# Patient Record
Sex: Female | Born: 1990 | Race: Black or African American | Hispanic: No | Smoking: Current every day smoker
Health system: Southern US, Community
[De-identification: ages and names within clinical notes are randomized; demographics above are authoritative.]

## PROBLEM LIST (undated history)

## (undated) ENCOUNTER — Inpatient Hospital Stay: Payer: Self-pay

## (undated) DIAGNOSIS — Z8742 Personal history of other diseases of the female genital tract: Secondary | ICD-10-CM

## (undated) DIAGNOSIS — Z862 Personal history of diseases of the blood and blood-forming organs and certain disorders involving the immune mechanism: Secondary | ICD-10-CM

## (undated) DIAGNOSIS — Z87440 Personal history of urinary (tract) infections: Secondary | ICD-10-CM

## (undated) DIAGNOSIS — O24419 Gestational diabetes mellitus in pregnancy, unspecified control: Secondary | ICD-10-CM

## (undated) DIAGNOSIS — J449 Chronic obstructive pulmonary disease, unspecified: Secondary | ICD-10-CM

## (undated) DIAGNOSIS — Z789 Other specified health status: Secondary | ICD-10-CM

## (undated) HISTORY — DX: Personal history of diseases of the blood and blood-forming organs and certain disorders involving the immune mechanism: Z86.2

## (undated) HISTORY — DX: Personal history of urinary (tract) infections: Z87.440

## (undated) HISTORY — DX: Chronic obstructive pulmonary disease, unspecified: J44.9

## (undated) HISTORY — PX: NO PAST SURGERIES: SHX2092

## (undated) HISTORY — PX: LEEP: SHX91

## (undated) HISTORY — PX: OTHER SURGICAL HISTORY: SHX169

## (undated) HISTORY — DX: Personal history of other diseases of the female genital tract: Z87.42

---
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
## 2010-04-23 ENCOUNTER — Emergency Department: Payer: Self-pay | Admitting: Internal Medicine

## 2010-07-07 ENCOUNTER — Emergency Department: Payer: Self-pay | Admitting: Emergency Medicine

## 2010-07-14 ENCOUNTER — Encounter: Payer: Self-pay | Admitting: Obstetrics & Gynecology

## 2010-08-30 ENCOUNTER — Encounter: Payer: Self-pay | Admitting: Pediatric Cardiology

## 2010-09-13 ENCOUNTER — Encounter: Payer: Self-pay | Admitting: Pediatric Cardiology

## 2010-10-06 ENCOUNTER — Encounter: Payer: Self-pay | Admitting: Maternal and Fetal Medicine

## 2010-10-13 ENCOUNTER — Encounter: Payer: Self-pay | Admitting: Maternal and Fetal Medicine

## 2010-10-20 ENCOUNTER — Encounter: Payer: Self-pay | Admitting: Maternal and Fetal Medicine

## 2010-10-27 ENCOUNTER — Encounter: Payer: Self-pay | Admitting: Maternal & Fetal Medicine

## 2010-10-31 ENCOUNTER — Encounter: Payer: Self-pay | Admitting: Obstetrics and Gynecology

## 2010-11-03 ENCOUNTER — Encounter: Payer: Self-pay | Admitting: Obstetrics and Gynecology

## 2010-11-10 ENCOUNTER — Encounter: Payer: Self-pay | Admitting: Maternal & Fetal Medicine

## 2010-11-14 ENCOUNTER — Encounter: Payer: Self-pay | Admitting: Maternal and Fetal Medicine

## 2010-11-21 ENCOUNTER — Encounter: Payer: Self-pay | Admitting: Obstetrics and Gynecology

## 2010-12-01 ENCOUNTER — Inpatient Hospital Stay: Payer: Self-pay | Admitting: Obstetrics and Gynecology

## 2011-01-13 ENCOUNTER — Emergency Department: Payer: Self-pay | Admitting: Emergency Medicine

## 2012-04-09 IMAGING — US US FETAL BPP W/O NON-STRESS - NRPT
1 series · 8 of 8 positions shown · non-contrast
Comparison: none

[Series 1: us fetal bpp w/o non-stress - nrpt · 8 of 8 slices shown]
[im 1/8]
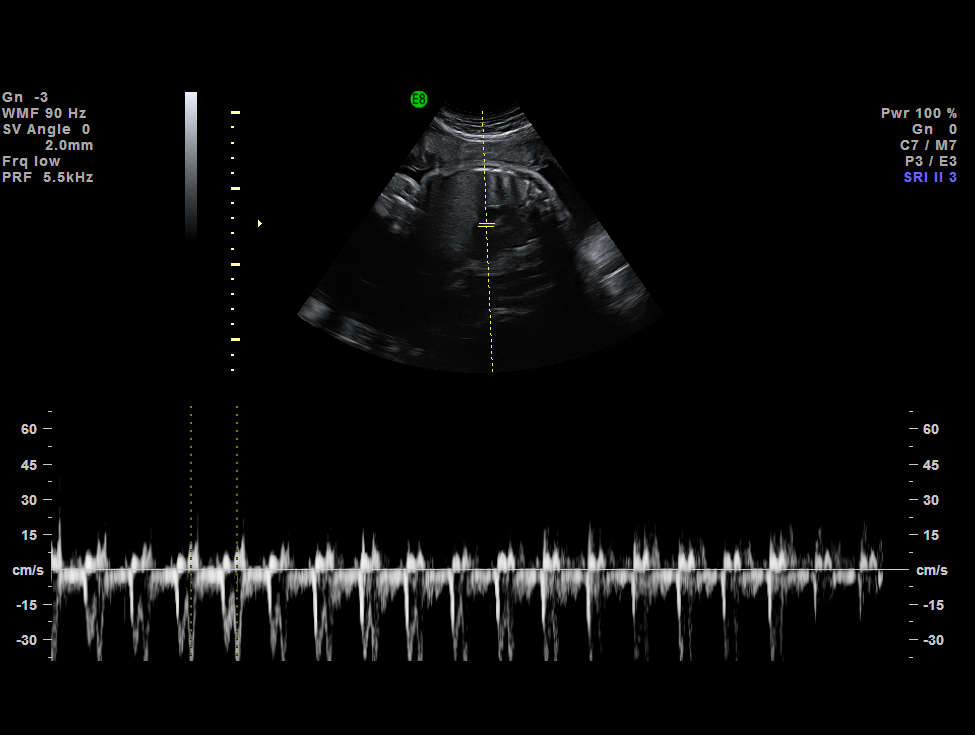
[im 2/8]
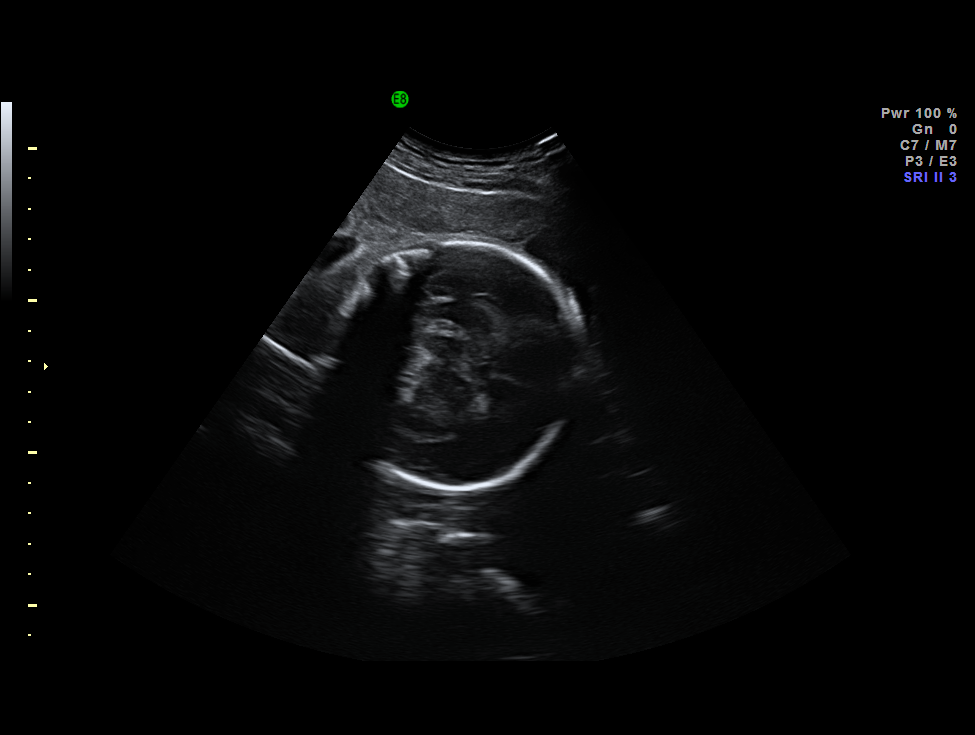
[im 3/8]
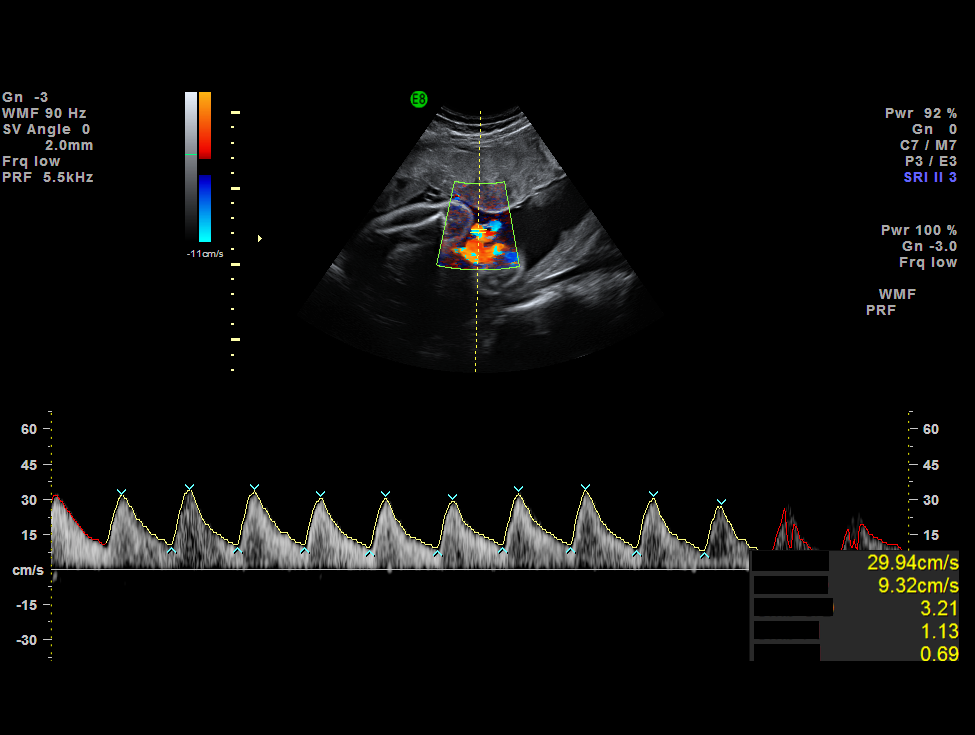
[im 4/8]
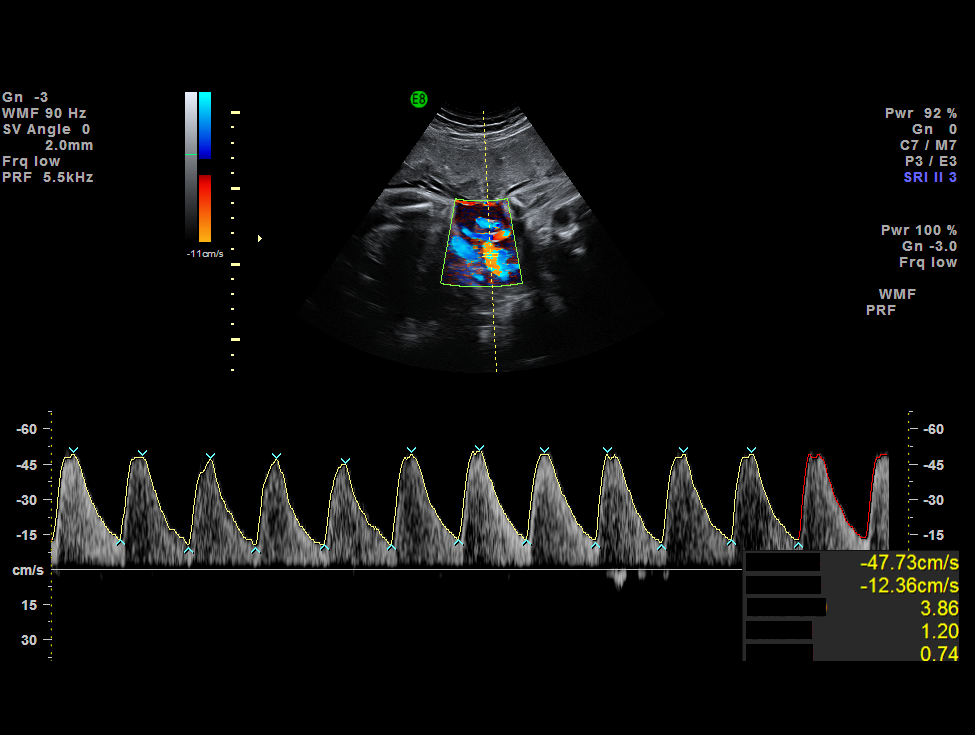
[im 5/8]
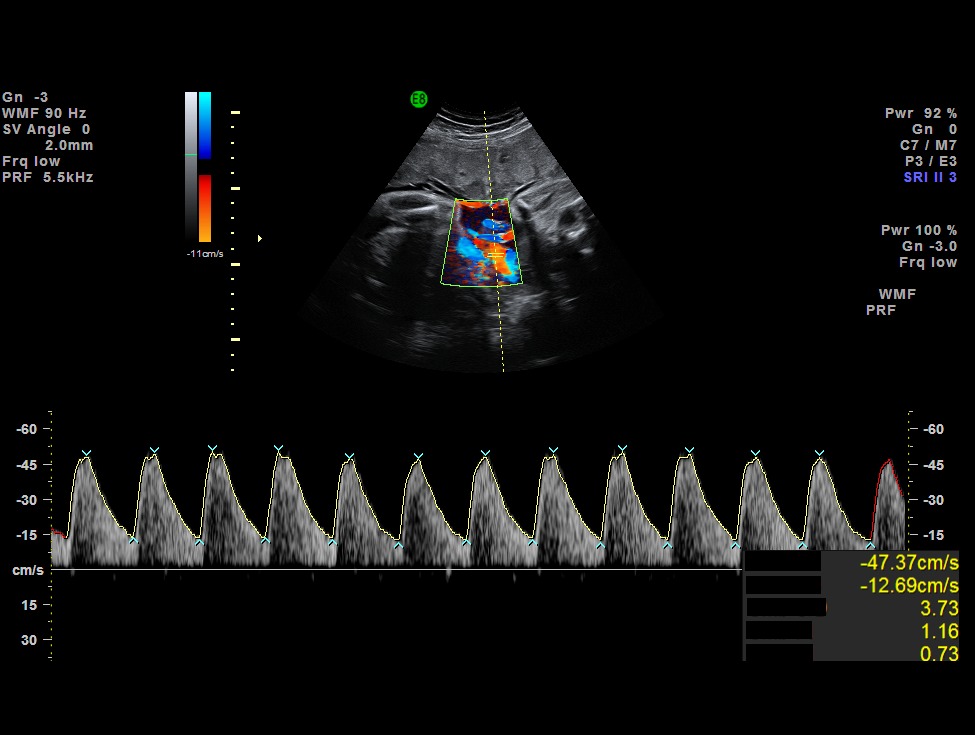
[im 6/8]
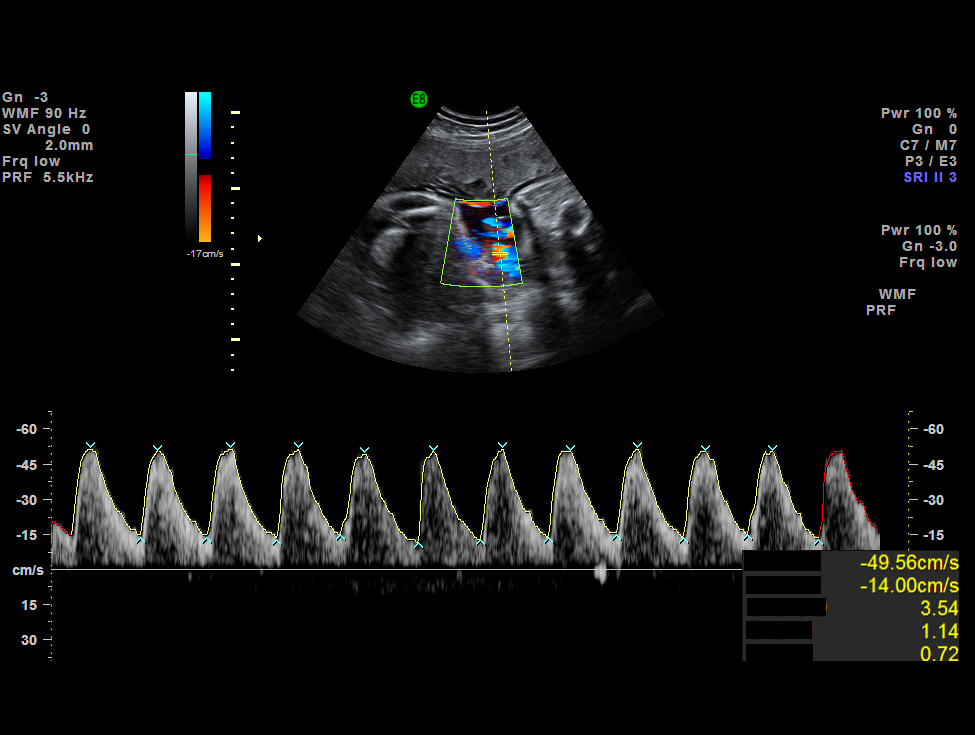
[im 7/8]
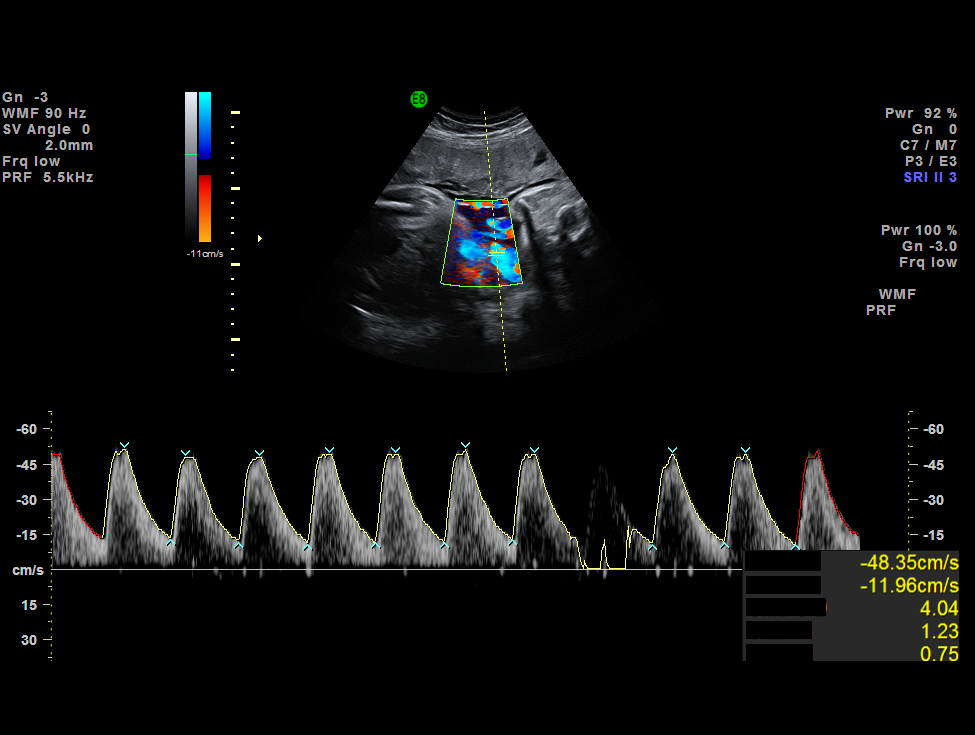
[im 8/8]
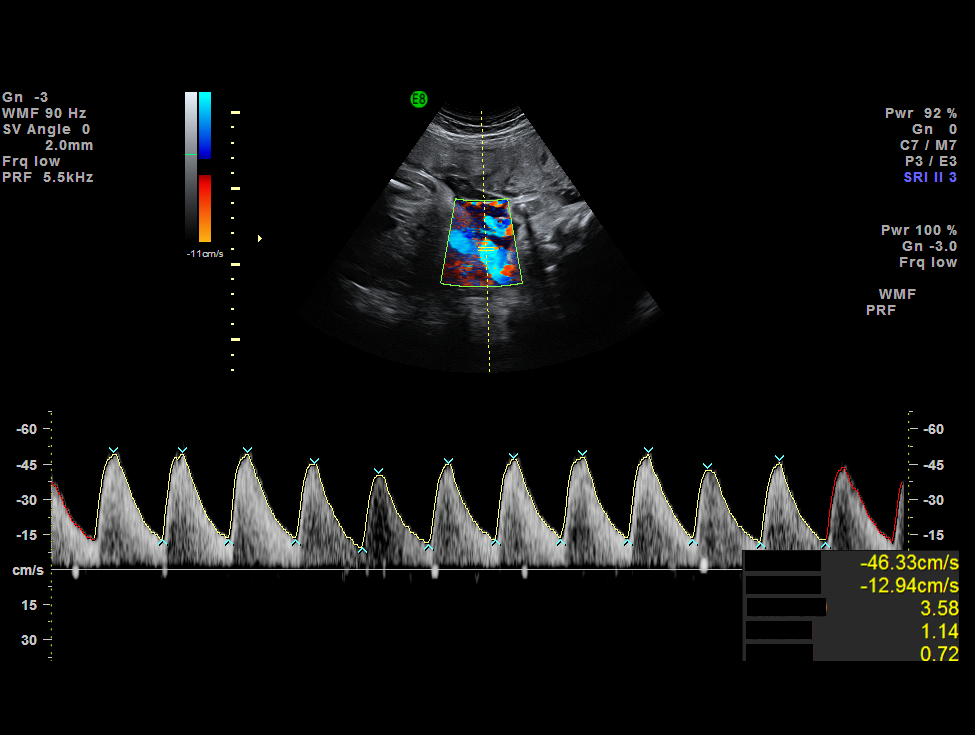

[8 of 8 positions shown; findings below may reference images not displayed]

IMAGES IMPORTED FROM THE [employer] WORKFLOW SYSTEM
NO DICTATION FOR STUDY

## 2014-01-11 ENCOUNTER — Emergency Department: Payer: Self-pay | Admitting: Emergency Medicine

## 2014-01-11 LAB — URINALYSIS, COMPLETE
Bacteria: NONE SEEN
Bilirubin,UR: NEGATIVE
Glucose,UR: NEGATIVE mg/dL (ref 0–75)
Ketone: NEGATIVE
Nitrite: NEGATIVE
Ph: 7 (ref 4.5–8.0)
Protein: NEGATIVE
RBC,UR: 3 /HPF (ref 0–5)
Specific Gravity: 1.017 (ref 1.003–1.030)
Squamous Epithelial: 4
WBC UR: 9 /HPF (ref 0–5)

## 2014-01-11 LAB — CBC WITH DIFFERENTIAL/PLATELET
Basophil #: 0 10*3/uL (ref 0.0–0.1)
Basophil %: 0.7 %
Eosinophil #: 0.2 10*3/uL (ref 0.0–0.7)
Eosinophil %: 2.9 %
HCT: 38.3 % (ref 35.0–47.0)
HGB: 13.3 g/dL (ref 12.0–16.0)
Lymphocyte #: 1.9 10*3/uL (ref 1.0–3.6)
Lymphocyte %: 28.2 %
MCH: 31 pg (ref 26.0–34.0)
MCHC: 34.8 g/dL (ref 32.0–36.0)
MCV: 89 fL (ref 80–100)
Monocyte #: 0.6 x10 3/mm (ref 0.2–0.9)
Monocyte %: 9.4 %
Neutrophil #: 3.9 10*3/uL (ref 1.4–6.5)
Neutrophil %: 58.8 %
Platelet: 218 10*3/uL (ref 150–440)
RBC: 4.3 10*6/uL (ref 3.80–5.20)
RDW: 13.5 % (ref 11.5–14.5)
WBC: 6.6 10*3/uL (ref 3.6–11.0)

## 2014-01-11 LAB — GC/CHLAMYDIA PROBE AMP

## 2014-01-11 LAB — COMPREHENSIVE METABOLIC PANEL
Albumin: 3.4 g/dL (ref 3.4–5.0)
Alkaline Phosphatase: 49 U/L
Anion Gap: 5 — ABNORMAL LOW (ref 7–16)
BUN: 6 mg/dL — ABNORMAL LOW (ref 7–18)
Bilirubin,Total: 0.2 mg/dL (ref 0.2–1.0)
Calcium, Total: 9.1 mg/dL (ref 8.5–10.1)
Chloride: 105 mmol/L (ref 98–107)
Co2: 26 mmol/L (ref 21–32)
Creatinine: 0.73 mg/dL (ref 0.60–1.30)
EGFR (African American): >60
EGFR (Non-African Amer.): >60
Glucose: 91 mg/dL (ref 65–99)
Osmolality: 269 (ref 275–301)
Potassium: 3.7 mmol/L (ref 3.5–5.1)
SGOT(AST): 12 U/L — ABNORMAL LOW (ref 15–37)
SGPT (ALT): 23 U/L (ref 12–78)
Sodium: 136 mmol/L (ref 136–145)
Total Protein: 7.5 g/dL (ref 6.4–8.2)

## 2014-01-11 LAB — HCG, QUANTITATIVE, PREGNANCY: Beta Hcg, Quant.: 70101 m[IU]/mL — ABNORMAL HIGH

## 2014-01-11 LAB — LIPASE, BLOOD: Lipase: 90 U/L (ref 73–393)

## 2014-01-11 LAB — WET PREP, GENITAL

## 2014-02-06 ENCOUNTER — Emergency Department: Payer: Self-pay | Admitting: Emergency Medicine

## 2014-06-25 ENCOUNTER — Observation Stay: Payer: Self-pay | Admitting: Obstetrics and Gynecology

## 2014-07-18 ENCOUNTER — Observation Stay: Payer: Self-pay

## 2014-08-02 ENCOUNTER — Inpatient Hospital Stay: Payer: Self-pay

## 2014-08-02 LAB — GC/CHLAMYDIA PROBE AMP

## 2014-08-02 LAB — CBC WITH DIFFERENTIAL/PLATELET
Basophil #: 0 10*3/uL (ref 0.0–0.1)
Basophil %: 0.5 %
Eosinophil #: 0.1 10*3/uL (ref 0.0–0.7)
Eosinophil %: 0.9 %
HCT: 38.4 % (ref 35.0–47.0)
HGB: 12.8 g/dL (ref 12.0–16.0)
Lymphocyte #: 2.4 10*3/uL (ref 1.0–3.6)
Lymphocyte %: 25.7 %
MCH: 29.8 pg (ref 26.0–34.0)
MCHC: 33.2 g/dL (ref 32.0–36.0)
MCV: 90 fL (ref 80–100)
Monocyte #: 1 x10 3/mm — ABNORMAL HIGH (ref 0.2–0.9)
Monocyte %: 10.7 %
Neutrophil #: 5.7 10*3/uL (ref 1.4–6.5)
Neutrophil %: 62.2 %
Platelet: 134 10*3/uL — ABNORMAL LOW (ref 150–440)
RBC: 4.29 10*6/uL (ref 3.80–5.20)
RDW: 14.2 % (ref 11.5–14.5)
WBC: 9.2 10*3/uL (ref 3.6–11.0)

## 2014-08-03 LAB — HEMATOCRIT: HCT: 31.9 % — ABNORMAL LOW (ref 35.0–47.0)

## 2014-11-05 ENCOUNTER — Emergency Department: Payer: Self-pay | Admitting: Emergency Medicine

## 2015-03-02 NOTE — H&P (Signed)
L&D Evaluation:  History:  HPI Pt is a 24 yo G3P1011 at 4657w0d who presents with contractions that started this morning at work that are 30 minutes apart. She reports +FM, denies vb or lof. Her prenatal history is significant for a sga baby which was induced at 37 weeks, smoker in the beginning of pregnancy, trich with a  negative TOC 5/27, and depression on wellbutrin. Her last growth scan at 32 weeks had an EFW of the baby at 3# 10oz 28%. She is O+, VI, RNI.   Presents with contractions   Patient's Medical History depression   Patient's Surgical History none   Medications Pre Natal Vitamins  wellbutrin   Allergies NKDA   Social History tobacco  quit in pregnancy   Family History Non-Contributory   ROS:  ROS All systems were reviewed.  HEENT, CNS, GI, GU, Respiratory, CV, Renal and Musculoskeletal systems were found to be normal.   Exam:  Vital Signs stable   General no apparent distress   Mental Status clear   Abdomen gravid, non-tender   Mebranes Intact   FHT normal rate with no decels, 140's baseline, moderate variability, +accels to 160's, no decels   Ucx absent   Skin dry, no lesions, no rashes   Impression:  Impression reactive NST, IUP at 35 weeks, no evidence of PTL   Plan:  Plan discharge   Comments PTL precautions, FKC discussed.   Follow Up Appointment already scheduled   Electronic Signatures: Jannet MantisSubudhi, Dhyan Noah (CNM)  (Signed 03-Sep-15 11:20)  Authored: L&D Evaluation   Last Updated: 03-Sep-15 11:20 by Jannet MantisSubudhi, Leith Hedlund (CNM)

## 2015-03-02 NOTE — H&P (Signed)
L&D Evaluation:  History:  HPI Pt is a 24 yo G3P1011 with EDD of 07/30/14 per 12 wk US, presents at 124w3d with c/o onset contractions at 2200 yesterday, becoming more intense after 2300. +FM, no VB or LOF.    Her prenatal history is significant for a sga baby which was induced at 37 weeks, smoker in the beginning of pregnancy, trich with a  negative TOC 5/27, and depression (was  on wellbutrin for a short time this pregnancy-but discontinued it between 30-31 weeks). She also saw a dermatologist for uticaria and was treated with Zyrtec and kenalog. Growth scans have revealed normal growth this pregnancy with the last EFW at 36 6/7 weeks was 6#7oz (42.3%).  She is O+, VI, RNI., and GBS negative. The first trimester test and MSAFP were negative. Will be bottle feeding. [facility] Given [facility] 05/21/14   Presents with contractions   Patient's Medical History depression   Patient's Surgical History none   Medications Pre [place]atal Vitamins  wellbutrin   Allergies NKDA   Social History tobacco  quit in pregnancy   Family History Non-Contributory   ROS:  ROS see HPI   Exam:  Vital Signs stable  118/63   General breathing with contractions, desires pain medication/epidural   Mental Status clear   Chest clear   Heart normal sinus rhythm, no murmur/gallop/rubs   Abdomen gravid, tender with contractions   Estimated Fetal Weight Average for gestational age   Fetal Position cephalic   Edema 1+   Reflexes 3+   Pelvic no external lesions, 4cm on arrival, now 5/75%/-2/BBOW   Mebranes Intact   FHT normal rate with no decels, 135 with accels to 150s, CAT 1   Fetal Heart Rate 135   Ucx q2-8 min x 40-200 sec   Skin dry, no rashes   Impression:  Impression IUP at 40 3/7 weeks in labor   Plan:  Plan EFM/NST, monitor contractions and for cervical change, Stadol for pain. Epidural when labs are back. Plan AROM when comfortable.   Electronic Signatures: Trinna BalloonGutierrez,  Laithan Conchas L (CNM)  (Signed 11-Oct-15 02:53)  Authored: L&D Evaluation   Last Updated: 11-Oct-15 02:53 by Trinna BalloonGutierrez, Avaeh Ewer L (CNM)
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
## 2015-08-18 ENCOUNTER — Emergency Department
Admission: EM | Admit: 2015-08-18 | Discharge: 2015-08-18 | Discharge status: Against Medical Advice | Payer: Medicaid Other | Attending: Student | Admitting: Student

## 2015-08-18 ENCOUNTER — Encounter: Payer: Self-pay | Admitting: *Deleted

## 2015-08-18 ENCOUNTER — Emergency Department: Payer: Medicaid Other

## 2015-08-18 DIAGNOSIS — N83519 Torsion of ovary and ovarian pedicle, unspecified side: Secondary | ICD-10-CM

## 2015-08-18 DIAGNOSIS — Z3687 Encounter for antenatal screening for uncertain dates: Secondary | ICD-10-CM

## 2015-08-18 DIAGNOSIS — R1032 Left lower quadrant pain: Secondary | ICD-10-CM | POA: Diagnosis not present

## 2015-08-18 DIAGNOSIS — R110 Nausea: Secondary | ICD-10-CM | POA: Insufficient documentation

## 2015-08-18 DIAGNOSIS — R197 Diarrhea, unspecified: Secondary | ICD-10-CM | POA: Diagnosis not present

## 2015-08-18 DIAGNOSIS — R109 Unspecified abdominal pain: Secondary | ICD-10-CM

## 2015-08-18 DIAGNOSIS — Z3200 Encounter for pregnancy test, result unknown: Secondary | ICD-10-CM

## 2015-08-18 DIAGNOSIS — R52 Pain, unspecified: Secondary | ICD-10-CM

## 2015-08-18 DIAGNOSIS — Z3A10 10 weeks gestation of pregnancy: Secondary | ICD-10-CM | POA: Diagnosis not present

## 2015-08-18 DIAGNOSIS — R000 Tachycardia, unspecified: Secondary | ICD-10-CM | POA: Insufficient documentation

## 2015-08-18 DIAGNOSIS — O26899 Other specified pregnancy related conditions, unspecified trimester: Secondary | ICD-10-CM

## 2015-08-18 DIAGNOSIS — O9989 Other specified diseases and conditions complicating pregnancy, childbirth and the puerperium: Secondary | ICD-10-CM | POA: Insufficient documentation

## 2015-08-18 DIAGNOSIS — Z3491 Encounter for supervision of normal pregnancy, unspecified, first trimester: Secondary | ICD-10-CM

## 2015-08-18 LAB — URINALYSIS COMPLETE WITH MICROSCOPIC (ARMC ONLY)
BACTERIA UA: NONE SEEN
Bilirubin Urine: NEGATIVE
GLUCOSE, UA: NEGATIVE mg/dL
Hgb urine dipstick: NEGATIVE
Ketones, ur: NEGATIVE mg/dL
NITRITE: NEGATIVE
Protein, ur: NEGATIVE mg/dL
SPECIFIC GRAVITY, URINE: 1.017 (ref 1.005–1.030)
pH: 7 (ref 5.0–8.0)

## 2015-08-18 LAB — COMPREHENSIVE METABOLIC PANEL
ALT: 15 U/L (ref 14–54)
AST: 17 U/L (ref 15–41)
Albumin: 3.7 g/dL (ref 3.5–5.0)
Alkaline Phosphatase: 43 U/L (ref 38–126)
Anion gap: 8 (ref 5–15)
BUN: 7 mg/dL (ref 6–20)
CHLORIDE: 104 mmol/L (ref 101–111)
CO2: 23 mmol/L (ref 22–32)
Calcium: 9.4 mg/dL (ref 8.9–10.3)
Creatinine, Ser: 0.56 mg/dL (ref 0.44–1.00)
GFR calc non Af Amer: >60 mL/min (ref >60)
Glucose, Bld: 87 mg/dL (ref 65–99)
Potassium: 3.8 mmol/L (ref 3.5–5.1)
SODIUM: 135 mmol/L (ref 135–145)
Total Bilirubin: 0.5 mg/dL (ref 0.3–1.2)
Total Protein: 7.4 g/dL (ref 6.5–8.1)

## 2015-08-18 LAB — CBC WITH DIFFERENTIAL/PLATELET
BASOS ABS: 0 10*3/uL (ref 0–0.1)
BASOS PCT: 1 %
EOS ABS: 0.1 10*3/uL (ref 0–0.7)
EOS PCT: 2 %
HCT: 38.5 % (ref 35.0–47.0)
HEMOGLOBIN: 12.8 g/dL (ref 12.0–16.0)
LYMPHS ABS: 1.5 10*3/uL (ref 1.0–3.6)
Lymphocytes Relative: 25 %
MCH: 28.8 pg (ref 26.0–34.0)
MCHC: 33.3 g/dL (ref 32.0–36.0)
MCV: 86.4 fL (ref 80.0–100.0)
Monocytes Absolute: 0.5 10*3/uL (ref 0.2–0.9)
Monocytes Relative: 9 %
NEUTROS PCT: 63 %
Neutro Abs: 3.8 10*3/uL (ref 1.4–6.5)
PLATELETS: 206 10*3/uL (ref 150–440)
RBC: 4.46 MIL/uL (ref 3.80–5.20)
RDW: 14.9 % — ABNORMAL HIGH (ref 11.5–14.5)
WBC: 5.9 10*3/uL (ref 3.6–11.0)

## 2015-08-18 LAB — HCG, QUANTITATIVE, PREGNANCY: hCG, Beta Chain, Quant, S: 55433 m[IU]/mL — ABNORMAL HIGH (ref <5)

## 2015-08-18 LAB — LIPASE, BLOOD: LIPASE: 16 U/L (ref 11–51)

## 2015-08-18 MED ORDER — ACETAMINOPHEN 500 MG PO TABS
1000.0000 mg | ORAL_TABLET | Freq: Once | ORAL | Status: AC
  Administered 2015-08-18: 1000 mg via ORAL
  Filled 2015-08-18: qty 2

## 2015-08-18 MED ORDER — SODIUM CHLORIDE 0.9 % IV BOLUS (SEPSIS)
1000.0000 mL | Freq: Once | INTRAVENOUS | Status: AC
Start: 2015-08-18 — End: 2015-08-18
  Administered 2015-08-18: 1000 mL via INTRAVENOUS

## 2015-08-18 NOTE — ED Notes (Signed)
Attempted fetal heart tones with doppler. Was not successful.

## 2015-08-18 NOTE — ED Notes (Signed)
[facility] went to [facility] where they estimated due date to be May 1st.

## 2015-08-18 NOTE — ED Provider Notes (Signed)
[facility] Emergency Department Provider Note  ____________________________________________  Time seen: Approximately 9:59 AM  I have reviewed the triage vital signs and the nursing notes.   HISTORY  Chief Complaint Abdominal Pain    HPI Emily Peterson is a 24 y.o. female G4 P2 with no chronic medical problem presents for evaluation of diffuse lower abdominal cramping, gradual onset, intermittent for one week, worse with eating and associated with nausea. She believes she may be [redacted] weeks pregnant but cannot be sure. She believes her last menstrual period was in July. She reports she had some diarrhea in the middle of last week but reports that that has resolved and she is having normal bowel movements. No dysuria, hematuria, vomiting or fevers. No chest pain or difficulty breathing. Currently her symptoms are mild. She denies any abnormal vaginal bleeding or vaginal discharge.   History reviewed. No pertinent past medical history.  There are no active problems to display for this patient.   History reviewed. No pertinent past surgical history.  Current Outpatient Rx  Name  Route  Sig  Dispense  Refill  . Prenatal Vit-Fe Fumarate-FA (MULTIVITAMIN-PRENATAL) 27-0.8 MG TABS tablet   Oral   Take 1 tablet by mouth daily at 12 noon.           Allergies Review of patient's allergies indicates no known allergies.  No family history on file.  Social History Social History  Substance Use Topics  . Smoking status: Never Smoker   . Smokeless tobacco: None  . Alcohol Use: No    Review of Systems Constitutional: No fever/chills Eyes: No visual changes. ENT: No sore throat. Cardiovascular: Denies chest pain. Respiratory: Denies shortness of breath. Gastrointestinal: + abdominal pain.  + nausea, no vomiting.  No diarrhea.  No constipation. Genitourinary: Negative for dysuria. Musculoskeletal: Negative for back pain. Skin: Negative for  rash. Neurological: Negative for headaches, focal weakness or numbness.  10-point ROS otherwise negative.  ____________________________________________   PHYSICAL EXAM:  VITAL SIGNS: ED Triage Vitals  Enc Vitals Group     BP 08/18/15 0951 113/63 mmHg     Pulse Rate 08/18/15 0951 114     Resp 08/18/15 0951 20     Temp 08/18/15 0951 98.3 F (36.8 C)     Temp Source 08/18/15 0951 Oral     SpO2 08/18/15 0951 99 %     Weight 08/18/15 0951 185 lb (83.915 kg)     Height 08/18/15 0951 5\' 2"  (1.575 m)     Head Cir --      Peak Flow --      Pain Score --      Pain Loc --      Pain Edu? --      Excl. in GC? --     Constitutional: Alert and oriented. Well appearing and in no acute distress. Eyes: Conjunctivae are normal. PERRL. EOMI. Head: Atraumatic. Nose: No congestion/rhinnorhea. Mouth/Throat: Mucous membranes are moist.  Oropharynx non-erythematous. Neck: No stridor.  Cardiovascular: tachycardic rate, regular rhythm. Grossly normal heart sounds.  Good peripheral circulation. Respiratory: Normal respiratory effort.  No retractions. Lungs CTAB. Gastrointestinal: Soft with mild suprapubic and left lower quadrant tenderness. No CVA tenderness. Genitourinary: deferred Musculoskeletal: No lower extremity tenderness nor edema.  No joint effusions. Neurologic:  Normal speech and language. No gross focal neurologic deficits are appreciated. No gait instability. Skin:  Skin is warm, dry and intact. No rash noted. Psychiatric: Mood and affect are normal. Speech and behavior are normal.  ____________________________________________   LABS (all labs ordered are listed, but only abnormal results are displayed)  Labs Reviewed  CBC WITH DIFFERENTIAL/PLATELET - Abnormal; Notable for the following:    RDW 14.9 (*)    All other components within normal limits  URINALYSIS COMPLETEWITH MICROSCOPIC ([facility] ONLY) - Abnormal; Notable for the following:    Color, Urine YELLOW (*)    APPearance  HAZY (*)    Leukocytes, UA TRACE (*)    Squamous Epithelial / LPF 0-5 (*)    All other components within normal limits  HCG, QUANTITATIVE, PREGNANCY - Abnormal; Notable for the following:    hCG, Beta Chain, Quant, S 40981 (*)    All other components within normal limits  COMPREHENSIVE METABOLIC PANEL  LIPASE, BLOOD   ____________________________________________  EKG  none ____________________________________________  RADIOLOGY  Transvaginal US - pending ____________________________________________   PROCEDURES  Procedure(s) performed: None  Critical Care performed: No  ____________________________________________   INITIAL IMPRESSION / ASSESSMENT AND PLAN / ED COURSE  Pertinent labs & imaging results that were available during my care of the patient were reviewed by me and considered in my medical decision making (see chart for details).  Emily Peterson is a 24 y.o. female G4 P2 with no chronic medical problem presents for evaluation of diffuse lower abdominal cramping, gradual onset, intermittent for one week, worse with eating and associated with nausea. On exam, she does well appearing and in no acute distress. Mildly tachycardic but vital signs otherwise stable and she is afebrile. She does have some mild tenderness to palpation in the suprapubic region and the left lower quadrant. Plan for screening abdominal labs, ultrasound to rule out ectopic and evaluate for torsion of the left ovary. We'll give IV fluids, Tylenol, reassess for disposition.  ----------------------------------------- 12:20 PM on 08/18/2015 ----------------------------------------- The patient reports that she can't stay any longer because she has to pick up her son from school and she also reports that she is hungry. She is unwilling to stay for ultrasound or pelvic exam. Discussed with her risk of leaving AGAINST MEDICAL ADVICE including ruptured ectopic, injury to fetus, sudden death, loss of  current lifestyle. She voices understanding of this and says "I know that the pregnancy could be in the wrong place and it could make me die". She has signed AMA paperwork. Discussed with her that she should return at any time if she changes her mind and we are happy to treat her. She voices understanding of this. She left before receiving any paperwork.   ____________________________________________   FINAL CLINICAL IMPRESSION(S) / ED DIAGNOSES  Final diagnoses:  Pain  Abdominal pain in pregnancy      Gayla Doss, MD 08/18/15 [phone]

## 2015-08-18 NOTE — ED Notes (Signed)
Pt states a week ago she had 4 loose BM in a day. Since then abdomen has been cramping. Pain 8/10, cramping, "balls up, fiery feeling sometime." Pain is lower abdomen.

## 2015-08-18 NOTE — ED Notes (Signed)
Pt states "Im tired, my back hurts, I'm ready to go home, I'm hungry, I don't have time to wait"

## 2015-08-18 NOTE — ED Notes (Signed)
Pt here c/o abd pain, describes as cramps x 1 week.  Pt advises she around 10 weeks preg.  No prenatal care yet.

## 2015-09-30 ENCOUNTER — Other Ambulatory Visit: Payer: Self-pay | Admitting: Physician Assistant

## 2015-09-30 DIAGNOSIS — O0992 Supervision of high risk pregnancy, unspecified, second trimester: Secondary | ICD-10-CM

## 2015-10-01 LAB — OB RESULTS CONSOLE ABO/RH: RH TYPE: POSITIVE

## 2015-10-01 LAB — OB RESULTS CONSOLE RPR: RPR: NONREACTIVE

## 2015-10-01 LAB — OB RESULTS CONSOLE HIV ANTIBODY (ROUTINE TESTING): HIV: NONREACTIVE

## 2015-10-01 LAB — OB RESULTS CONSOLE RUBELLA ANTIBODY, IGM: Rubella: IMMUNE

## 2015-10-01 LAB — OB RESULTS CONSOLE VARICELLA ZOSTER ANTIBODY, IGG: Varicella: NON-IMMUNE/NOT IMMUNE

## 2015-10-01 LAB — OB RESULTS CONSOLE HEPATITIS B SURFACE ANTIGEN: HEP B S AG: NEGATIVE

## 2015-10-04 ENCOUNTER — Other Ambulatory Visit: Payer: Self-pay | Admitting: Physician Assistant

## 2015-10-04 ENCOUNTER — Ambulatory Visit
Admission: RE | Admit: 2015-10-04 | Discharge: 2015-10-04 | Disposition: A | Discharge status: Home / Self Care | Payer: Medicaid Other | Source: Ambulatory Visit | Attending: Physician Assistant | Admitting: Physician Assistant

## 2015-10-04 DIAGNOSIS — Z3A19 19 weeks gestation of pregnancy: Secondary | ICD-10-CM | POA: Insufficient documentation

## 2015-10-04 DIAGNOSIS — Z36 Encounter for antenatal screening of mother: Secondary | ICD-10-CM | POA: Insufficient documentation

## 2015-10-04 DIAGNOSIS — Z3689 Encounter for other specified antenatal screening: Secondary | ICD-10-CM

## 2015-10-21 ENCOUNTER — Other Ambulatory Visit: Payer: Self-pay | Admitting: Physician Assistant

## 2015-10-21 DIAGNOSIS — N2889 Other specified disorders of kidney and ureter: Secondary | ICD-10-CM

## 2015-10-24 NOTE — L&D Delivery Note (Signed)
Delivery Note At 6:08 AM a viable female sex was delivered via Vaginal, Spontaneous Delivery (Presentation: Left Occiput Anterior).  APGAR:8,9 weight: 6#11oz Placenta status: Intact, Spontaneous.  Cord: 3 vessels with the following complications: None.  Cord pH: n/a  Anesthesia: Epidural  Episiotomy: None Lacerations:   Suture Repair: none Est. Blood Loss (mL): 300  Mom to postpartum.  Baby to Couplet care / Skin to Skin.  Quick second stage. Shoulder dystocia (possibly d/t pt's position in bed), relieved with reposition and suprapubic pressure. Infant vigorous, to maternal abdomen for delayed cord clamping, cut by FOB after pulsation stopped. Infant to skin-to-skin with mother.   Baby's Name: Emily Peterson  Emily Peterson 02/24/2016, 6:40 AM

## 2015-11-11 ENCOUNTER — Ambulatory Visit
Admission: RE | Admit: 2015-11-11 | Discharge: 2015-11-11 | Disposition: A | Discharge status: Home / Self Care | Payer: Self-pay | Source: Ambulatory Visit | Attending: Maternal & Fetal Medicine | Admitting: Maternal & Fetal Medicine

## 2015-11-11 DIAGNOSIS — N2889 Other specified disorders of kidney and ureter: Secondary | ICD-10-CM

## 2015-11-29 LAB — HM HIV SCREENING LAB: HM HIV Screening: NEGATIVE

## 2016-01-13 ENCOUNTER — Other Ambulatory Visit: Payer: Self-pay

## 2016-01-13 ENCOUNTER — Ambulatory Visit
Admission: RE | Admit: 2016-01-13 | Discharge: 2016-01-13 | Disposition: A | Discharge status: Home / Self Care | Payer: Medicaid Other | Source: Ambulatory Visit | Attending: Maternal & Fetal Medicine | Admitting: Maternal & Fetal Medicine

## 2016-01-13 DIAGNOSIS — O359XX1 Maternal care for (suspected) fetal abnormality and damage, unspecified, fetus 1: Secondary | ICD-10-CM | POA: Diagnosis present

## 2016-01-13 DIAGNOSIS — Z36 Encounter for antenatal screening of mother: Secondary | ICD-10-CM | POA: Insufficient documentation

## 2016-01-13 DIAGNOSIS — Z3A33 33 weeks gestation of pregnancy: Secondary | ICD-10-CM | POA: Insufficient documentation

## 2016-02-15 ENCOUNTER — Observation Stay
Admission: EM | Admit: 2016-02-15 | Discharge: 2016-02-15 | Disposition: A | Discharge status: Home / Self Care | Payer: Medicaid Other | Attending: Advanced Practice Midwife | Admitting: Advanced Practice Midwife

## 2016-02-15 DIAGNOSIS — O471 False labor at or after 37 completed weeks of gestation: Principal | ICD-10-CM | POA: Insufficient documentation

## 2016-02-15 DIAGNOSIS — Z3A40 40 weeks gestation of pregnancy: Secondary | ICD-10-CM | POA: Insufficient documentation

## 2016-02-15 DIAGNOSIS — Z87891 Personal history of nicotine dependence: Secondary | ICD-10-CM | POA: Diagnosis not present

## 2016-02-15 MED ORDER — ACETAMINOPHEN 325 MG PO TABS: 650.0000 mg | ORAL_TABLET | ORAL | Status: DC | PRN

## 2016-02-15 MED ORDER — CALCIUM CARBONATE ANTACID 500 MG PO CHEW: 2.0000 | CHEWABLE_TABLET | ORAL | Status: DC | PRN

## 2016-02-15 NOTE — Discharge Summary (Signed)
Patient discharged with instructions on follow up appointment, labor precautions, and when to seek medical attention. Patient ambulatory at discharge with steady gait. Patient ambulatory with significant other at discharge.

## 2016-02-15 NOTE — Final Progress Note (Signed)
  Emily Peterson is a 25 y.o. [phone] with Estimated Date of Delivery: 02/21/16  who presents at 7073w1d  presenting for labor evaluation. Pt /o regular contractions. She was checked initially and re-examined by RN about 2 hours later with no cervical change (1cm/60%). Pt was not seen by me, but I reviewed FHR strip and vitals per below.     Prenatal Transfer Tool    No pertinent past medical history.   No pertinent past surgical history.  OB History  Gravida Para Term Preterm AB SAB TAB Ectopic Multiple Living  3 2 1       2     # Outcome Date GA Lbr Len/2nd Weight Sex Delivery Anes PTL Lv  3 Current           2 Term 08/02/14    Heide ScalesM Vag-Spont   Y  1 Para 12/03/10    M Vag-Spont   Y      Social History   Social History  . Marital Status: Single    Spouse Name: N/A  . Number of Children: N/A  . Years of Education: N/A   Social History Main Topics  . Smoking status: Former Games developermoker  . Smokeless tobacco: Never Used  . Alcohol Use: No  . Drug Use: No  . Sexual Activity: Yes   Other Topics Concern  . None   Social History Narrative    Family History  Problem Relation Age of Onset  . Diabetes Father     No prescriptions prior to admission    No Known Allergies  Review of Systems: Negative except for what is mentioned in HPI.  Physical Exam: BP 114/66 mmHg  Pulse 89  Temp(Src) 98 F (36.7 C) (Oral)  Resp 19  Ht 5\' 2"  (1.575 m)  Wt 204 lb (92.534 kg)  BMI 37.30 kg/m2  LMP 05/23/2015  Cervical Exam: Dilatation 1cm   Effacement 60%      FHT: Category: 1 Baseline rate 135 bpm   Variability moderate  Accelerations present   Decelerations none Contractions: irregular   Pertinent Labs/Studies:   No results found for this or any previous visit (from the past 24 hour(s)).  Assessment : IUP at 6973w1d, not in labor  Plan: Discharge Home  Labor precautions F/u at office as previously scheduled

## 2016-02-23 ENCOUNTER — Observation Stay
Admission: EM | Admit: 2016-02-23 | Discharge: 2016-02-23 | Disposition: A | Discharge status: Home / Self Care | Payer: Medicaid Other | Source: Home / Self Care | Admitting: Obstetrics and Gynecology

## 2016-02-23 ENCOUNTER — Inpatient Hospital Stay
Admission: EM | Admit: 2016-02-23 | Discharge: 2016-02-25 | DRG: 774 | Disposition: A | Discharge status: Home / Self Care | Payer: Medicaid Other | Attending: Advanced Practice Midwife | Admitting: Advanced Practice Midwife

## 2016-02-23 DIAGNOSIS — O4202 Full-term premature rupture of membranes, onset of labor within 24 hours of rupture: Principal | ICD-10-CM | POA: Diagnosis present

## 2016-02-23 DIAGNOSIS — Z23 Encounter for immunization: Secondary | ICD-10-CM

## 2016-02-23 DIAGNOSIS — Z87891 Personal history of nicotine dependence: Secondary | ICD-10-CM

## 2016-02-23 DIAGNOSIS — O9081 Anemia of the puerperium: Secondary | ICD-10-CM | POA: Diagnosis not present

## 2016-02-23 DIAGNOSIS — D649 Anemia, unspecified: Secondary | ICD-10-CM | POA: Diagnosis not present

## 2016-02-23 DIAGNOSIS — O4290 Premature rupture of membranes, unspecified as to length of time between rupture and onset of labor, unspecified weeks of gestation: Secondary | ICD-10-CM | POA: Diagnosis present

## 2016-02-23 DIAGNOSIS — O98319 Other infections with a predominantly sexual mode of transmission complicating pregnancy, unspecified trimester: Secondary | ICD-10-CM | POA: Diagnosis present

## 2016-02-23 DIAGNOSIS — A599 Trichomoniasis, unspecified: Secondary | ICD-10-CM | POA: Diagnosis present

## 2016-02-23 DIAGNOSIS — O480 Post-term pregnancy: Secondary | ICD-10-CM | POA: Diagnosis present

## 2016-02-23 HISTORY — DX: Other specified health status: Z78.9

## 2016-02-23 MED ORDER — METRONIDAZOLE 500 MG PO TABS
2000.0000 mg | ORAL_TABLET | Freq: Once | ORAL | Status: AC
Start: 2016-02-23 — End: 2016-02-23
  Administered 2016-02-23: 2000 mg via ORAL
  Filled 2016-02-23: qty 4

## 2016-02-23 NOTE — OB Triage Note (Signed)
Patient presented from ED complaining she has been leaking clear fluid since Monday evening around 2000. Denies any vaginal bleeding or decreased fetal movement.

## 2016-02-23 NOTE — Final Progress Note (Signed)
Physician Final Progress Note  Patient ID: Emily JarredJasmine R Peterson MRN: [id number] DOB/AGE: 1991-02-16 25 y.o.  Admit date: 02/23/2016 Admitting provider: Vena AustriaAndreas Tunya Held, MD Discharge date: 02/23/2016   Admission Diagnoses: Leaking fluid  Discharge Diagnoses:  Active Problems:   Amniotic fluid leaking Trichomonas  25 yo [id number] 2898w2d presenting for vaginal fluid / leaking fluid since yesterday afternoon.  Clear.  +FM, no LOF, no VB, irregular contractions.  Wet mount +Trichomonas.  Treated with flagyl 2g po once prior to discharge.  IOL set for 02/28/16 if not delivered  Consults: None  Significant Findings/ Diagnostic Studies: trichomonas on wet mount  Procedures:  Wet Mount: + motile trichomonas, negative clue cells, negative hyphea Ferning Slide: negative ferning NST: reactive NST 150, moderate variability, +accels, no decels  Discharge Condition: good  Disposition: 01-Home or Self Care  Diet: Regular diet  Discharge Activity: Activity as tolerated  Discharge Instructions    Discharge activity:  No Restrictions    Complete by:  As directed      Discharge diet:  No restrictions    Complete by:  As directed      Fetal Kick Count:  Lie on our left side for one hour after a meal, and count the number of times your baby kicks.  If it is less than 5 times, get up, move around and drink some juice.  Repeat the test 30 minutes later.  If it is still less than 5 kicks in an hour, notify your doctor.    Complete by:  As directed      LABOR:  When conractions begin, you should start to time them from the beginning of one contraction to the beginning  of the next.  When contractions are 5 - 10 minutes apart or less and have been regular for at least an hour, you should call your health care provider.    Complete by:  As directed      No sexual activity restrictions    Complete by:  As directed   No intercourse until your sexual partners have been treated     Notify physician for bleeding  from the vagina    Complete by:  As directed      Notify physician for blurring of vision or spots before the eyes    Complete by:  As directed      Notify physician for chills or fever    Complete by:  As directed      Notify physician for fainting spells, "black outs" or loss of consciousness    Complete by:  As directed      Notify physician for increase in vaginal discharge    Complete by:  As directed      Notify physician for leaking of fluid    Complete by:  As directed      Notify physician for pain or burning when urinating    Complete by:  As directed      Notify physician for pelvic pressure (sudden increase)    Complete by:  As directed      Notify physician for severe or continued nausea or vomiting    Complete by:  As directed      Notify physician for sudden gushing of fluid from the vagina (with or without continued leaking)    Complete by:  As directed      Notify physician for sudden, constant, or occasional abdominal pain    Complete by:  As directed      Notify  physician if baby moving less than usual    Complete by:  As directed             Medication List    TAKE these medications        multivitamin-prenatal 27-0.8 MG Tabs tablet  Take 1 tablet by mouth daily at 12 noon.           Follow-up Information    Follow up with [facility] LABOR AND DELIVERY. Go on 02/28/2016.   Why:  08:00 AM for induction of labor   Contact information:   [address] [id number] [place] [address] [phone]      Total time spent taking care of this patient: 20 minutes  Signed: Lorrene Reid 02/23/2016, 2:14 AM

## 2016-02-23 NOTE — Discharge Instructions (Signed)
Return to Labor and Delivery on 02/28/16 at 8:00 am for induction of labor.

## 2016-02-24 ENCOUNTER — Inpatient Hospital Stay: Payer: Medicaid Other | Admitting: Anesthesiology

## 2016-02-24 DIAGNOSIS — D649 Anemia, unspecified: Secondary | ICD-10-CM | POA: Diagnosis not present

## 2016-02-24 DIAGNOSIS — A599 Trichomoniasis, unspecified: Secondary | ICD-10-CM | POA: Diagnosis present

## 2016-02-24 DIAGNOSIS — O98319 Other infections with a predominantly sexual mode of transmission complicating pregnancy, unspecified trimester: Secondary | ICD-10-CM | POA: Diagnosis present

## 2016-02-24 DIAGNOSIS — Z87891 Personal history of nicotine dependence: Secondary | ICD-10-CM | POA: Diagnosis not present

## 2016-02-24 DIAGNOSIS — O480 Post-term pregnancy: Secondary | ICD-10-CM | POA: Diagnosis present

## 2016-02-24 DIAGNOSIS — Z23 Encounter for immunization: Secondary | ICD-10-CM | POA: Diagnosis not present

## 2016-02-24 DIAGNOSIS — O4202 Full-term premature rupture of membranes, onset of labor within 24 hours of rupture: Secondary | ICD-10-CM | POA: Diagnosis present

## 2016-02-24 DIAGNOSIS — O9081 Anemia of the puerperium: Secondary | ICD-10-CM | POA: Diagnosis not present

## 2016-02-24 LAB — CBC
HCT: 33.8 % — ABNORMAL LOW (ref 35.0–47.0)
HEMATOCRIT: 31.9 % — AB (ref 35.0–47.0)
HEMOGLOBIN: 10.8 g/dL — AB (ref 12.0–16.0)
HEMOGLOBIN: 11.7 g/dL — AB (ref 12.0–16.0)
MCH: 30.4 pg (ref 26.0–34.0)
MCH: 30.5 pg (ref 26.0–34.0)
MCHC: 33.9 g/dL (ref 32.0–36.0)
MCHC: 34.7 g/dL (ref 32.0–36.0)
MCV: 87.6 fL (ref 80.0–100.0)
MCV: 89.9 fL (ref 80.0–100.0)
PLATELETS: 137 10*3/uL — AB (ref 150–440)
Platelets: 118 10*3/uL — ABNORMAL LOW (ref 150–440)
RBC: 3.54 MIL/uL — ABNORMAL LOW (ref 3.80–5.20)
RBC: 3.86 MIL/uL (ref 3.80–5.20)
RDW: 13.5 % (ref 11.5–14.5)
RDW: 13.8 % (ref 11.5–14.5)
WBC: 10.2 10*3/uL (ref 3.6–11.0)
WBC: 7.8 10*3/uL (ref 3.6–11.0)

## 2016-02-24 LAB — TYPE AND SCREEN
ABO/RH(D): O POS
ANTIBODY SCREEN: NEGATIVE

## 2016-02-24 LAB — ABO/RH: ABO/RH(D): O POS

## 2016-02-24 MED ORDER — PRENATAL MULTIVITAMIN CH
1.0000 | ORAL_TABLET | Freq: Every day | ORAL | Status: DC
  Administered 2016-02-24 – 2016-02-25 (×2): 1 via ORAL
  Filled 2016-02-24 (×2): qty 1

## 2016-02-24 MED ORDER — LACTATED RINGERS IV SOLN: 500.0000 mL | INTRAVENOUS | Status: DC | PRN

## 2016-02-24 MED ORDER — ONDANSETRON HCL 4 MG/2ML IJ SOLN: 4.0000 mg | INTRAMUSCULAR | Status: DC | PRN

## 2016-02-24 MED ORDER — DIPHENHYDRAMINE HCL 50 MG/ML IJ SOLN: 12.5000 mg | INTRAMUSCULAR | Status: DC | PRN

## 2016-02-24 MED ORDER — HYDROMORPHONE HCL 1 MG/ML IJ SOLN
INTRAMUSCULAR | Status: AC
  Filled 2016-02-24: qty 1

## 2016-02-24 MED ORDER — TERBUTALINE SULFATE 1 MG/ML IJ SOLN
INTRAMUSCULAR | Status: AC
  Administered 2016-02-24: 0.25 mg via SUBCUTANEOUS
  Filled 2016-02-24: qty 1

## 2016-02-24 MED ORDER — ZOLPIDEM TARTRATE 5 MG PO TABS: 5.0000 mg | ORAL_TABLET | Freq: Every evening | ORAL | Status: DC | PRN

## 2016-02-24 MED ORDER — OXYCODONE-ACETAMINOPHEN 5-325 MG PO TABS
2.0000 | ORAL_TABLET | ORAL | Status: DC | PRN
  Administered 2016-02-24 – 2016-02-25 (×5): 2 via ORAL
  Filled 2016-02-24 (×5): qty 2

## 2016-02-24 MED ORDER — BUPIVACAINE HCL (PF) 0.25 % IJ SOLN
INTRAMUSCULAR | Status: DC | PRN
  Administered 2016-02-24: 5 mL via EPIDURAL

## 2016-02-24 MED ORDER — LIDOCAINE HCL (PF) 1 % IJ SOLN
INTRAMUSCULAR | Status: DC | PRN
  Administered 2016-02-24: 1 mL via INTRADERMAL

## 2016-02-24 MED ORDER — ACETAMINOPHEN 325 MG PO TABS: 650.0000 mg | ORAL_TABLET | ORAL | Status: DC | PRN

## 2016-02-24 MED ORDER — COCONUT OIL OIL: 1.0000 "application " | TOPICAL_OIL | Status: DC | PRN

## 2016-02-24 MED ORDER — LACTATED RINGERS IV SOLN: INTRAVENOUS | Status: DC

## 2016-02-24 MED ORDER — DIPHENHYDRAMINE HCL 25 MG PO CAPS: 25.0000 mg | ORAL_CAPSULE | Freq: Four times a day (QID) | ORAL | Status: DC | PRN

## 2016-02-24 MED ORDER — FENTANYL 2.5 MCG/ML W/ROPIVACAINE 0.2% IN NS 100 ML EPIDURAL INFUSION (ARMC-ANES)
10.0000 mL/h | EPIDURAL | Status: DC
  Administered 2016-02-24: 10 mL/h via EPIDURAL
  Filled 2016-02-24: qty 100

## 2016-02-24 MED ORDER — ONDANSETRON HCL 4 MG PO TABS: 4.0000 mg | ORAL_TABLET | ORAL | Status: DC | PRN

## 2016-02-24 MED ORDER — TERBUTALINE SULFATE 1 MG/ML IJ SOLN
0.2500 mg | Freq: Once | INTRAMUSCULAR | Status: AC
  Administered 2016-02-24: 0.25 mg via SUBCUTANEOUS

## 2016-02-24 MED ORDER — DIPHENHYDRAMINE HCL 25 MG PO CAPS: 25.0000 mg | ORAL_CAPSULE | ORAL | Status: DC | PRN

## 2016-02-24 MED ORDER — ONDANSETRON HCL 4 MG/2ML IJ SOLN: 4.0000 mg | Freq: Three times a day (TID) | INTRAMUSCULAR | Status: DC | PRN

## 2016-02-24 MED ORDER — NALBUPHINE HCL 10 MG/ML IJ SOLN: 5.0000 mg | Freq: Once | INTRAMUSCULAR | Status: DC | PRN

## 2016-02-24 MED ORDER — MEDROXYPROGESTERONE ACETATE 150 MG/ML IM SUSP
150.0000 mg | Freq: Once | INTRAMUSCULAR | Status: AC
  Administered 2016-02-25: 150 mg via INTRAMUSCULAR
  Filled 2016-02-24: qty 1

## 2016-02-24 MED ORDER — DOCUSATE SODIUM 100 MG PO CAPS
100.0000 mg | ORAL_CAPSULE | Freq: Two times a day (BID) | ORAL | Status: DC | PRN
  Filled 2016-02-24: qty 1

## 2016-02-24 MED ORDER — VARICELLA VIRUS VACCINE LIVE 1350 PFU/0.5ML ~~LOC~~ INJ
0.5000 mL | INJECTION | SUBCUTANEOUS | Status: AC | PRN
Start: 2016-02-24 — End: 2016-02-25
  Administered 2016-02-25: 0.5 mL via SUBCUTANEOUS
  Filled 2016-02-24 (×2): qty 0.5

## 2016-02-24 MED ORDER — DIBUCAINE 1 % RE OINT
1.0000 | TOPICAL_OINTMENT | RECTAL | Status: DC | PRN
Start: 2016-02-24 — End: 2016-02-25

## 2016-02-24 MED ORDER — OXYCODONE-ACETAMINOPHEN 5-325 MG PO TABS
1.0000 | ORAL_TABLET | ORAL | Status: DC | PRN
  Administered 2016-02-25: 1 via ORAL
  Filled 2016-02-24: qty 1

## 2016-02-24 MED ORDER — LIDOCAINE HCL (PF) 1 % IJ SOLN
INTRAMUSCULAR | Status: AC
  Filled 2016-02-24: qty 30

## 2016-02-24 MED ORDER — LIDOCAINE-EPINEPHRINE (PF) 1.5 %-1:200000 IJ SOLN
INTRAMUSCULAR | Status: DC | PRN
  Administered 2016-02-24: 3 mL via EPIDURAL

## 2016-02-24 MED ORDER — MISOPROSTOL 200 MCG PO TABS
800.0000 ug | ORAL_TABLET | Freq: Once | ORAL | Status: AC
  Administered 2016-02-24: 800 ug via RECTAL

## 2016-02-24 MED ORDER — LACTATED RINGERS IV SOLN
INTRAVENOUS | Status: DC
  Administered 2016-02-24: 02:00:00 via INTRAVENOUS

## 2016-02-24 MED ORDER — SIMETHICONE 80 MG PO CHEW: 80.0000 mg | CHEWABLE_TABLET | ORAL | Status: DC | PRN

## 2016-02-24 MED ORDER — MORPHINE SULFATE (PF) 2 MG/ML IV SOLN
INTRAVENOUS | Status: AC
  Administered 2016-02-24: 2 mg via INTRAVENOUS
  Filled 2016-02-24: qty 1

## 2016-02-24 MED ORDER — SODIUM CHLORIDE 0.9% FLUSH: 3.0000 mL | INTRAVENOUS | Status: DC | PRN

## 2016-02-24 MED ORDER — METHYLERGONOVINE MALEATE 0.2 MG/ML IJ SOLN
0.2000 mg | Freq: Once | INTRAMUSCULAR | Status: AC
  Administered 2016-02-24: 0.2 mg via INTRAMUSCULAR

## 2016-02-24 MED ORDER — OXYTOCIN BOLUS FROM INFUSION
500.0000 mL | INTRAVENOUS | Status: DC
  Administered 2016-02-24: 500 mL via INTRAVENOUS

## 2016-02-24 MED ORDER — WITCH HAZEL-GLYCERIN EX PADS
1.0000 | MEDICATED_PAD | CUTANEOUS | Status: DC | PRN
Start: 2016-02-24 — End: 2016-02-25

## 2016-02-24 MED ORDER — OXYTOCIN 40 UNITS IN LACTATED RINGERS INFUSION - SIMPLE MED
2.5000 [IU]/h | INTRAVENOUS | Status: DC
  Filled 2016-02-24: qty 1000

## 2016-02-24 MED ORDER — BENZOCAINE-MENTHOL 20-0.5 % EX AERO: 1.0000 "application " | INHALATION_SPRAY | CUTANEOUS | Status: DC | PRN

## 2016-02-24 MED ORDER — NALBUPHINE HCL 10 MG/ML IJ SOLN: 5.0000 mg | INTRAMUSCULAR | Status: DC | PRN

## 2016-02-24 MED ORDER — IBUPROFEN 600 MG PO TABS
600.0000 mg | ORAL_TABLET | Freq: Four times a day (QID) | ORAL | Status: DC | PRN
  Administered 2016-02-24 – 2016-02-25 (×5): 600 mg via ORAL
  Filled 2016-02-24 (×5): qty 1

## 2016-02-24 MED ORDER — NALOXONE HCL 2 MG/2ML IJ SOSY: 1.0000 ug/kg/h | PREFILLED_SYRINGE | INTRAVENOUS | Status: DC | PRN

## 2016-02-24 MED ORDER — NALOXONE HCL 0.4 MG/ML IJ SOLN: 0.4000 mg | INTRAMUSCULAR | Status: DC | PRN

## 2016-02-24 MED ORDER — BUTORPHANOL TARTRATE 1 MG/ML IJ SOLN: 2.0000 mg | INTRAMUSCULAR | Status: DC | PRN

## 2016-02-24 MED ORDER — METHYLERGONOVINE MALEATE 0.2 MG/ML IJ SOLN
INTRAMUSCULAR | Status: AC
  Administered 2016-02-24: 0.2 mg via INTRAMUSCULAR
  Filled 2016-02-24: qty 1

## 2016-02-24 MED ORDER — FENTANYL 2.5 MCG/ML W/ROPIVACAINE 0.2% IN NS 100 ML EPIDURAL INFUSION (ARMC-ANES)
EPIDURAL | Status: AC
  Filled 2016-02-24: qty 100

## 2016-02-24 MED ORDER — HYDROMORPHONE HCL 1 MG/ML IJ SOLN: 0.2500 mg | Freq: Once | INTRAMUSCULAR | Status: DC

## 2016-02-24 MED ORDER — MORPHINE SULFATE (PF) 2 MG/ML IV SOLN
2.0000 mg | Freq: Once | INTRAVENOUS | Status: AC
  Administered 2016-02-24: 2 mg via INTRAVENOUS

## 2016-02-24 MED ORDER — CEFAZOLIN SODIUM 1-5 GM-% IV SOLN
1.0000 g | Freq: Once | INTRAVENOUS | Status: AC
  Administered 2016-02-24: 1 g via INTRAVENOUS
  Filled 2016-02-24: qty 50

## 2016-02-24 MED ORDER — MISOPROSTOL 200 MCG PO TABS
ORAL_TABLET | ORAL | Status: AC
  Administered 2016-02-24: 800 ug via RECTAL
  Filled 2016-02-24: qty 4

## 2016-02-24 MED ORDER — FERROUS SULFATE 325 (65 FE) MG PO TABS
325.0000 mg | ORAL_TABLET | Freq: Every day | ORAL | Status: DC
  Administered 2016-02-25: 325 mg via ORAL
  Filled 2016-02-24: qty 1

## 2016-02-24 NOTE — H&P (Signed)
OB History & Physical   History of Present Illness:  Chief Complaint: contractions  HPI:  Emily Peterson is a 25 y.o. [phone] female at 159w3d dated by LMP & 19 week US.  Her pregnancy has been notable for + Trichomonas dx & tx on 5/2 and concern for rt fetal pelviectasis (now resolved). .    She reports contractions.   She denies leakage of fluid.   She denies vaginal bleeding.   She reports fetal movement.    Maternal Medical History:   Past Medical History  Diagnosis Date  . Medical history non-contributory     Past Surgical History  Procedure Laterality Date  . No past surgeries      No Known Allergies  Prior to Admission medications   Medication Sig Start Date End Date Taking? Authorizing Provider  Prenatal Vit-Fe Fumarate-FA (MULTIVITAMIN-PRENATAL) 27-0.8 MG TABS tablet Take 1 tablet by mouth daily at 12 noon.   Yes Historical Provider, MD    OB History  Gravida Para Term Preterm AB SAB TAB Ectopic Multiple Living  3 2 1 1      2     # Outcome Date GA Lbr Len/2nd Weight Sex Delivery Anes PTL Lv  3 Current           2 Term 08/02/14   7 lb 1 oz (3.204 kg) M Vag-Spont EPI  Y     Complications: Postpartum hemorrhage  1 Preterm 12/03/10 282w0d  4 lb 11 oz (2.126 kg) M Vag-Spont EPI N Y     Complications: Low birth weight      Prenatal care site: [facility]  Social History: She  reports that she has quit smoking. She has never used smokeless tobacco. She reports that she does not drink alcohol or use illicit drugs.  Family History: family history includes Diabetes in her father.   Review of Systems: Negative x 10 systems reviewed except as noted in the HPI.    Physical Exam:  Vital Signs: BP 106/55 mmHg  Pulse 75  Temp(Src) 98.2 F (36.8 C) (Oral)  Resp 18  Ht 5\' 2"  (1.575 m)  Wt 208 lb (94.348 kg)  BMI 38.03 kg/m2  SpO2 100%  LMP 05/23/2015 General: no acute distress.  HEENT: normocephalic, atraumatic Abdomen: soft, gravid, non-tender;  EFW: 7-7.5 lbs Pelvic:    External: Normal external female genitalia  Cervix: Dilation: 9 / Effacement (%): 80 / Station: -1  - 6-7 on presentation to hospital   Extremities: non-tender Neurologic: Alert & oriented x 3.    Pertinent Results:  Prenatal Labs: Blood type/Rh O positive  Antibody screen negative  Rubella Immune  Varicella Not immune    RPR NR  HBsAg negative  HIV negative  GC negative  Chlamydia negative  Genetic screening QUAD neg  1 hour GTT 109  3 hour GTT N/A  GBS negative   FHR: category 1 Baseline FHR: 125 beats/min   Variability: moderate   Accelerations: present   Decelerations: absent Contractions: present frequency: 2-3   Assessment:  Emily Peterson is a 25 y.o. 543P1102 female at 6459w3d in active labor.   Plan:  1. Admit to Labor & Delivery  2. CBC, T&S, Clrs, IVF 3. GBS negative.   4. Fetwal well-being: reassuring, category 1 tracing 5. AROM performed with recent exam - light meconium noted.  Nursery notified.   Marta AntuBrothers, Katrinia Straker, [place] 02/24/2016 4:18 AM

## 2016-02-24 NOTE — Anesthesia Preprocedure Evaluation (Signed)
Anesthesia Evaluation  Patient identified by MRN, date of birth, ID band Patient awake    Reviewed: Allergy & Precautions, H&P , NPO status , Patient's Chart, lab work & pertinent test results  History of Anesthesia Complications Negative for: history of anesthetic complications  Airway Mallampati: III  TM Distance: >3 FB Neck ROM: full    Dental  (+) Poor Dentition   Pulmonary neg pulmonary ROS, neg shortness of breath,    Pulmonary exam normal breath sounds clear to auscultation       Cardiovascular Exercise Tolerance: Good (-) hypertensionnegative cardio ROS Normal cardiovascular exam Rhythm:regular Rate:Normal     Neuro/Psych    GI/Hepatic negative GI ROS,   Endo/Other    Renal/GU negative Renal ROS  negative genitourinary   Musculoskeletal   Abdominal   Peds  Hematology negative hematology ROS (+)   Anesthesia Other Findings Past Medical History:   Medical history non-contributory                            Past Surgical History:   NO PAST SURGERIES                                            BMI    Body Mass Index   38.03 kg/m 2      Reproductive/Obstetrics (+) Pregnancy                             Anesthesia Physical Anesthesia Plan  ASA: II  Anesthesia Plan: General   Post-op Pain Management:    Induction:   Airway Management Planned:   Additional Equipment:   Intra-op Plan:   Post-operative Plan:   Informed Consent: I have reviewed the patients History and Physical, chart, labs and discussed the procedure including the risks, benefits and alternatives for the proposed anesthesia with the patient or authorized representative who has indicated his/her understanding and acceptance.   Dental Advisory Given  Plan Discussed with: Anesthesiologist, CRNA and Surgeon  Anesthesia Plan Comments:         Anesthesia Quick Evaluation

## 2016-02-24 NOTE — Progress Notes (Signed)
Assisted to BR.  Bleeding is scant.  Voided sufficient amount.  Pericare performed.  To wheelchair.  Transfer safely to [id number].

## 2016-02-24 NOTE — Progress Notes (Signed)
Called to room for heavy bleeding on fundal exam and mildly boggy fundus. Multiple blood clots manually expelled from LUS. Pt given Cytotec 800 mcg PR, Methergine 0.2 mg IM and 2nd Pitocin bolus. Fundus firm and bleeding WNL currently. Will continue to monitor. CBC ordered for 4 hours from now.

## 2016-02-24 NOTE — Anesthesia Procedure Notes (Signed)
Epidural Patient location during procedure: OB Start time: 02/24/2016 1:41 AM End time: 02/24/2016 1:43 AM  Staffing Anesthesiologist: Margorie JohnPISCITELLO, JOSEPH K Performed by: anesthesiologist   Preanesthetic Checklist Completed: patient identified, site marked, surgical consent, pre-op evaluation, timeout performed, IV checked, risks and benefits discussed and monitors and equipment checked  Epidural Patient position: sitting Prep: Betadine Patient monitoring: heart rate, continuous pulse ox and blood pressure Approach: midline Location: L4-L5 Injection technique: LOR saline  Needle:  Needle type: Tuohy  Needle gauge: 17 G Needle length: 9 cm and 9 Needle insertion depth: 7 cm Catheter type: closed end flexible Catheter size: 19 Gauge Catheter at skin depth: 12 cm Test dose: negative and 1.5% lidocaine with Epi 1:200 K  Assessment Sensory level: T10 Events: blood not aspirated, injection not painful, no injection resistance, negative IV test and no paresthesia  Additional Notes   Patient tolerated the insertion well without immediate complications.Reason for block:procedure for pain

## 2016-02-24 NOTE — Discharge Summary (Signed)
Obstetrical Discharge Summary  Date of Admission: 02/23/2016 Date of Discharge: 02/25/2016  Primary OB: [facility]  Gestational Age at Delivery: [id number]   Antepartum complications: none Reason for Admission: Labor Date of Delivery: 02/24/16  Delivered By: T. Brothers, CNM Delivery Type: spontaneous vaginal delivery Intrapartum complications/course: Postpartum Hemorrhage - slightly delayed. Given Cytotec and Methergine.  Anesthesia: epidural Placenta: Delivered and expressed via active management. Intact: yes. To pathology: no.  Laceration: none Episiotomy: none Baby: Liveborn female, APGARs 8/9, weight 3030 g.   Discharge Diagnosis: Delivered. Postpartum hemorrhage. Anemia  Postpartum course: Postpartum course was complicated by a PPH 5-6 hours postpartum. The bleeding was brought under control with Methergine and Cytotec and evacuation of clots from the uterus. Hemoglobin decreased from 11.7gm/dl to 0.9WJ/XB9.4gm/dl. Patient was asymptomatic of anemia and was hemodynamically stable. She was experiencing some cramping and back pain on PPD#1, but the pain was controlled on po analgesics, and bleeding  was appropriate. She was also tolerating a regular diet and voiding without difficulty. She desired to go home on PPD#1 and was deemed stable for discharge. Discharge Vital Signs:  Current Vital Signs 24h Vital Sign Ranges  T 97.7 F (36.5 C) Temp  Avg: 97.9 F (36.6 C)  Min: 97.7 F (36.5 C)  Max: 98.1 F (36.7 C)  BP 108/66 mmHg BP  Min: 108/66  Max: 114/75  HR 66 Pulse  Avg: 62.8  Min: 58  Max: 66  RR 18 Resp  Avg: 18.4  Min: 18  Max: 20  SaO2 99 % Not Delivered SpO2  Avg: 99 %  Min: 99 %  Max: 99 %       24 Hour I/O Current Shift I/O  Time Ins Outs 05/04 0701 - 05/05 0700 In: 240 [P.O.:240] Out: 1938 [Urine:750] 05/05 0701 - 05/05 1900 In: 3822 [P.O.:240; I.V.:3582] Out: -    Results for orders placed or performed during the hospital encounter of 02/23/16 (from the past 24 hour(s))  CBC      Status: Abnormal   Collection Time: 02/25/16  5:38 AM  Result Value Ref Range   WBC 6.6 3.6 - 11.0 K/uL   RBC 3.14 (L) 3.80 - 5.20 MIL/uL   Hemoglobin 9.4 (L) 12.0 - 16.0 g/dL   HCT 14.727.6 (L) 82.935.0 - 56.247.0 %   MCV 87.8 80.0 - 100.0 fL   MCH 29.8 26.0 - 34.0 pg   MCHC 34.0 32.0 - 36.0 g/dL   RDW 13.013.7 86.511.5 - 78.414.5 %   Platelets 121 (L) 150 - 440 K/uL  O POS/ RI/ VNI/ GBS negative. TDAP UTD   Discharge Exam:  NAD Lochia: serosa, moderate amt Fundus: firm/ at U/ ML/ NT Ext: no evidence of DVT  Disposition: Home  Rh Immune globulin given: not applicable Rubella vaccine given: no Varicella Vaccine given: yes Tdap vaccine given in AP or PP setting: yes  Contraception: Depo-Provera  Prenatal/Postnatal Panel: O POS//Rubella Immune//Varicella Not immune//RPR negative//HIV negative/HepB Surface Ag negative//plans to bottle feed  Plan:  Zenovia JarredJasmine R Leavey was discharged to home in good condition. [facility]-up [facility] in 6 weeks for a postpartum visit  Discharge Medications:   Medication List    TAKE these medications        FUSION PLUS Caps  Take 1 capsule by mouth daily.     ibuprofen 600 MG tablet  Commonly known as:  ADVIL,MOTRIN  Take 1 tablet (600 mg total) by mouth every 6 (six) hours as needed for moderate pain.  multivitamin-prenatal 27-0.8 MG Tabs tablet  Take 1 tablet by mouth daily at 12 noon.     oxyCODONE-acetaminophen 5-325 MG tablet  Commonly known as:  PERCOCET/ROXICET  Take 1-2 tablets by mouth every 4 (four) hours as needed for moderate pain or severe pain (pain scale 4-7).       Farrel Conners, CNM

## 2016-02-25 LAB — CBC
HCT: 27.6 % — ABNORMAL LOW (ref 35.0–47.0)
Hemoglobin: 9.4 g/dL — ABNORMAL LOW (ref 12.0–16.0)
MCH: 29.8 pg (ref 26.0–34.0)
MCHC: 34 g/dL (ref 32.0–36.0)
MCV: 87.8 fL (ref 80.0–100.0)
PLATELETS: 121 10*3/uL — AB (ref 150–440)
RBC: 3.14 MIL/uL — ABNORMAL LOW (ref 3.80–5.20)
RDW: 13.7 % (ref 11.5–14.5)
WBC: 6.6 10*3/uL (ref 3.6–11.0)

## 2016-02-25 LAB — RPR: RPR Ser Ql: NONREACTIVE

## 2016-02-25 MED ORDER — IBUPROFEN 600 MG PO TABS: 600.0000 mg | ORAL_TABLET | Freq: Four times a day (QID) | ORAL | Status: DC | PRN

## 2016-02-25 MED ORDER — OXYCODONE-ACETAMINOPHEN 5-325 MG PO TABS: 1.0000 | ORAL_TABLET | ORAL | Status: DC | PRN

## 2016-02-25 MED ORDER — FUSION PLUS PO CAPS: 1.0000 | ORAL_CAPSULE | Freq: Every day | ORAL | Status: DC

## 2016-02-25 NOTE — Anesthesia Postprocedure Evaluation (Signed)
Anesthesia Post Note  Patient: Emily JarredJasmine R Peterson  Procedure(s) Performed: * No procedures listed *  Patient location during evaluation: Mother Baby Anesthesia Type: Epidural Level of consciousness: awake and alert and oriented Pain management: pain level controlled Vital Signs Assessment: post-procedure vital signs reviewed and stable Respiratory status: spontaneous breathing Cardiovascular status: stable Postop Assessment: no headache Anesthetic complications: no    Last Vitals:  Filed Vitals:   02/24/16 2336 02/25/16 0401  BP: 111/59 114/75  Pulse: 62 64  Temp: 36.7 C 36.6 C  Resp: 18 18    Last Pain:  Filed Vitals:   02/25/16 0401  PainSc: 9                  Nayah Lukens,  Alessandra BevelsJennifer M

## 2016-02-25 NOTE — Progress Notes (Signed)
Pt discharged home with infant.  Discharge instructions and follow up appointment given to and reviewed with pt.  Pt verbalized understanding.  Escorted by auxillary. 

## 2016-02-25 NOTE — Discharge Instructions (Signed)
Vaginal Delivery, Care After Refer to this sheet in the next few weeks. These discharge instructions provide you with information on caring for yourself after delivery. Your caregiver may also give you specific instructions. Your treatment has been planned according to the most current medical practices available, but problems sometimes occur. Call your caregiver if you have any problems or questions after you go home. HOME CARE INSTRUCTIONS 1. Take over-the-counter or prescription medicines only as directed by your caregiver or pharmacist. 2. Do not drink alcohol, especially if you are breastfeeding or taking medicine to relieve pain. 3. Do not smoke tobacco. 4. Continue to use good perineal care. Good perineal care includes: 1. Wiping your perineum from back to front 2. Keeping your perineum clean. 3. You can do sitz baths twice a day, to help keep this area clean 5. Do not use tampons, douche or have sex until your caregiver says it is okay. 6. Shower only and avoid sitting in submerged water, aside from sitz baths 7. Wear a well-fitting bra that provides breast support. 8. Eat healthy foods. 9. Drink enough fluids to keep your urine clear or pale yellow. 10. Eat high-fiber foods such as whole grain cereals and breads, brown rice, beans, and fresh fruits and vegetables every day. These foods may help prevent or relieve constipation. 11. Avoid constipation with high fiber foods or medications, such as miralax or metamucil 12. Follow your caregiver's recommendations regarding resumption of activities such as climbing stairs, driving, lifting, exercising, or traveling. 13. Talk to your caregiver about resuming sexual activities. Resumption of sexual activities is dependent upon your risk of infection, your rate of healing, and your comfort and desire to resume sexual activity. 14. Try to have someone help you with your household activities and your newborn for at least a few days after you leave  the hospital. 15. Rest as much as possible. Try to rest or take a nap when your newborn is sleeping. 16. Increase your activities gradually. 17. Keep all of your scheduled postpartum appointments. It is very important to keep your scheduled follow-up appointments. At these appointments, your caregiver will be checking to make sure that you are healing physically and emotionally. SEEK MEDICAL CARE IF:   You are passing large clots from your vagina. Save any clots to show your caregiver.  You have a foul smelling discharge from your vagina.  You have trouble urinating.  You are urinating frequently.  You have pain when you urinate.  You have a change in your bowel movements.  You have increasing redness, pain, or swelling near your vaginal incision (episiotomy) or vaginal tear.  You have pus draining from your episiotomy or vaginal tear.  Your episiotomy or vaginal tear is separating.  You have painful, hard, or reddened breasts.  You have a severe headache.  You have blurred vision or see spots.  You feel sad or depressed.  You have thoughts of hurting yourself or your newborn.  You have questions about your care, the care of your newborn, or medicines.  You are dizzy or light-headed.  You have a rash.  You have nausea or vomiting.  You were breastfeeding and have not had a menstrual period within 12 weeks after you stopped breastfeeding.  You are not breastfeeding and have not had a menstrual period by the 12th week after delivery.  You have a fever. SEEK IMMEDIATE MEDICAL CARE IF:   You have persistent pain.  You have chest pain.  You have shortness of breath.    You faint.  You have leg pain.  You have stomach pain.  Your vaginal bleeding saturates two or more sanitary pads in 1 hour. MAKE SURE YOU:   Understand these instructions.  Will watch your condition.  Will get help right away if you are not doing well or get worse. Document Released:  10/06/2000 Document Revised: 02/23/2014 Document Reviewed: 06/05/2012 ExitCare Patient Information 2015 [employer]. This information is not intended to replace advice given to you by your health care provider. Make sure you discuss any questions you have with your health care provider.  Sitz Bath A sitz bath is a warm water bath taken in the sitting position. The water covers only the hips and butt (buttocks). We recommend using one that fits in the toilet, to help with ease of use and cleanliness. It may be used for either healing or cleaning purposes. Sitz baths are also used to relieve pain, itching, or muscle tightening (spasms). The water may contain medicine. Moist heat will help you heal and relax.  HOME CARE  Take 3 to 4 sitz baths a day. 18. Fill the bathtub half-full with warm water. 19. Sit in the water and open the drain a little. 20. Turn on the warm water to keep the tub half-full. Keep the water running constantly. 21. Soak in the water for 15 to 20 minutes. 22. After the sitz bath, pat the affected area dry. GET HELP RIGHT AWAY IF: You get worse instead of better. Stop the sitz baths if you get worse. MAKE SURE YOU:  Understand these instructions.  Will watch your condition.  Will get help right away if you are not doing well or get worse. Document Released: 11/16/2004 Document Revised: 07/03/2012 Document Reviewed: 02/06/2011 ExitCare Patient Information 2015 [employer]. This information is not intended to replace advice given to you by your health care provider. Make sure you discuss any questions you have with your health care provider.    

## 2016-03-09 ENCOUNTER — Emergency Department
Admission: EM | Admit: 2016-03-09 | Discharge: 2016-03-09 | Disposition: A | Discharge status: Home / Self Care | Payer: Medicaid Other | Attending: Emergency Medicine | Admitting: Emergency Medicine

## 2016-03-09 DIAGNOSIS — B9689 Other specified bacterial agents as the cause of diseases classified elsewhere: Secondary | ICD-10-CM | POA: Diagnosis not present

## 2016-03-09 DIAGNOSIS — Z87891 Personal history of nicotine dependence: Secondary | ICD-10-CM | POA: Diagnosis not present

## 2016-03-09 DIAGNOSIS — Z79899 Other long term (current) drug therapy: Secondary | ICD-10-CM | POA: Diagnosis not present

## 2016-03-09 DIAGNOSIS — A5901 Trichomonal vulvovaginitis: Secondary | ICD-10-CM | POA: Insufficient documentation

## 2016-03-09 DIAGNOSIS — N760 Acute vaginitis: Secondary | ICD-10-CM

## 2016-03-09 DIAGNOSIS — O8620 Urinary tract infection following delivery, unspecified: Secondary | ICD-10-CM | POA: Diagnosis not present

## 2016-03-09 DIAGNOSIS — N390 Urinary tract infection, site not specified: Secondary | ICD-10-CM

## 2016-03-09 DIAGNOSIS — L299 Pruritus, unspecified: Secondary | ICD-10-CM | POA: Diagnosis present

## 2016-03-09 LAB — POCT PREGNANCY, URINE: Preg Test, Ur: NEGATIVE

## 2016-03-09 LAB — URINALYSIS COMPLETE WITH MICROSCOPIC (ARMC ONLY)
BILIRUBIN URINE: NEGATIVE
Glucose, UA: NEGATIVE mg/dL
Ketones, ur: NEGATIVE mg/dL
Nitrite: NEGATIVE
PH: 6 (ref 5.0–8.0)
PROTEIN: NEGATIVE mg/dL
Specific Gravity, Urine: 1.014 (ref 1.005–1.030)

## 2016-03-09 LAB — CHLAMYDIA/NGC RT PCR (ARMC ONLY)
Chlamydia Tr: NOT DETECTED
N gonorrhoeae: NOT DETECTED

## 2016-03-09 LAB — WET PREP, GENITAL
Sperm: NONE SEEN
Yeast Wet Prep HPF POC: NONE SEEN

## 2016-03-09 MED ORDER — SULFAMETHOXAZOLE-TRIMETHOPRIM 800-160 MG PO TABS: 1.0000 | ORAL_TABLET | Freq: Two times a day (BID) | ORAL | Status: DC

## 2016-03-09 NOTE — ED Notes (Signed)
Pt reports 2 weeks postpartum. Reports vaginal itching and swelling.

## 2016-03-09 NOTE — Discharge Instructions (Signed)
Increase fluids. Begin taking antibiotics as prescribed for the next 10 days. [facility] recheck of your urine and also if you have continued problems.

## 2016-03-09 NOTE — ED Provider Notes (Signed)
[facility] Emergency Department Provider Note   ____________________________________________  Time seen: Approximately 12:19 PM  I have reviewed the triage vital signs and the nursing notes.   HISTORY  Chief Complaint Vaginal Itching   HPI Emily Peterson is a 25 y.o. female is here with complaint of vaginal itching. Patient states that while she was in the emergency room giving birth to her baby 2 weeks ago she was given IV antibiotics and believes that she has a yeast infection. She has not used any over-the-counter medications for yeast infections. She denies any vaginal discharge and denies any intercourse since her delivery. She also continues to have some spotting vaginally. Patient has not followed up with her OB/GYN at this time. She denies breast-feeding. She denies any fever or chills. There's been no nausea or vomiting.Currently she she rates her pain as 6/10.  Patient states that she was treated for Trichomonas with 2 tablets prior to leaving the hospital after her delivery     Past Medical History  Diagnosis Date  . Medical history non-contributory     Patient Active Problem List   Diagnosis Date Noted  . Amniotic fluid leaking 02/23/2016  . Postpartum care following vaginal delivery 02/15/2016    Past Surgical History  Procedure Laterality Date  . No past surgeries      Current Outpatient Rx  Name  Route  Sig  Dispense  Refill  . ibuprofen (ADVIL,MOTRIN) 600 MG tablet   Oral   Take 1 tablet (600 mg total) by mouth every 6 (six) hours as needed for moderate pain.   50 tablet   1   . Iron-FA-B Cmp-C-Biot-Probiotic (FUSION PLUS) CAPS   Oral   Take 1 capsule by mouth daily.   30 capsule   1   . oxyCODONE-acetaminophen (PERCOCET/ROXICET) 5-325 MG tablet   Oral   Take 1-2 tablets by mouth every 4 (four) hours as needed for moderate pain or severe pain (pain scale 4-7).   30 tablet   0   . Prenatal Vit-Fe Fumarate-FA  (MULTIVITAMIN-PRENATAL) 27-0.8 MG TABS tablet   Oral   Take 1 tablet by mouth daily at 12 noon.         . sulfamethoxazole-trimethoprim (BACTRIM DS,SEPTRA DS) 800-160 MG tablet   Oral   Take 1 tablet by mouth 2 (two) times daily.   20 tablet   0     Allergies Review of patient's allergies indicates no known allergies.  Family History  Problem Relation Age of Onset  . Diabetes Father     Social History Social History  Substance Use Topics  . Smoking status: Former Games developer  . Smokeless tobacco: Never Used  . Alcohol Use: No    Review of Systems Constitutional: No fever/chills ENT: No sore throat. Cardiovascular: Denies chest pain. Respiratory: Denies shortness of breath. Gastrointestinal: No abdominal pain.  No nausea, no vomiting.   Genitourinary: Negative for dysuria.Positive vaginal itching. Musculoskeletal: Negative for back pain. Skin: Negative for rash. Neurological: Negative for headaches, focal weakness or numbness.  10-point ROS otherwise negative.  ____________________________________________   PHYSICAL EXAM:  VITAL SIGNS: ED Triage Vitals  Enc Vitals Group     BP 03/09/16 1150 118/91 mmHg     Pulse Rate 03/09/16 1150 109     Resp 03/09/16 1150 18     Temp 03/09/16 1150 98.1 F (36.7 C)     Temp Source 03/09/16 1150 Oral     SpO2 03/09/16 1150 100 %  Weight 03/09/16 1150 190 lb (86.183 kg)     Height 03/09/16 1150 5\' 4"  (1.626 m)     Head Cir --      Peak Flow --      Pain Score 03/09/16 1149 6     Pain Loc --      Pain Edu? --      Excl. in GC? --     Constitutional: Alert and oriented. Well appearing and in no acute distress. Eyes: Conjunctivae are normal. PERRL. EOMI. Head: Atraumatic. Nose: No congestion/rhinnorhea. Neck: No stridor.   Cardiovascular: Normal rate, regular rhythm. Grossly normal heart sounds.  Good peripheral circulation. Respiratory: Normal respiratory effort.  No retractions. Lungs CTAB. Gastrointestinal: Soft  With minimal tenderness in suprapubic area. Bowel sounds normal active. Genitourinary: Pelvic exam is difficult secondary to patient's discomfort. Mucus mixed with vaginal blood was seen. No lesions or rashes were identified. There is minimal adnexal tenderness and no cervical motion tenderness on exam. Wet prep was sent along with GC and chlamydia. Musculoskeletal: No lower extremity tenderness nor edema.   Neurologic:  Normal speech and language. No gross focal neurologic deficits are appreciated. No gait instability. Skin:  Skin is warm, dry and intact. No rash noted. Psychiatric: Mood and affect are normal. Speech and behavior are normal.  ____________________________________________   LABS (all labs ordered are listed, but only abnormal results are displayed)  Labs Reviewed  WET PREP, GENITAL - Abnormal; Notable for the following:    Trich, Wet Prep PRESENT (*)    Clue Cells Wet Prep HPF POC PRESENT (*)    WBC, Wet Prep HPF POC MANY (*)    All other components within normal limits  URINALYSIS COMPLETEWITH MICROSCOPIC ([facility] ONLY) - Abnormal; Notable for the following:    Color, Urine YELLOW (*)    APPearance CLEAR (*)    Hgb urine dipstick 2+ (*)    Leukocytes, UA 3+ (*)    Bacteria, UA RARE (*)    Squamous Epithelial / LPF 0-5 (*)    All other components within normal limits  CHLAMYDIA/NGC RT PCR ([facility] ONLY)  URINE CULTURE  POC URINE PREG, ED  POCT PREGNANCY, URINE    PROCEDURES  Procedure(s) performed: None  Critical Care performed: No  ____________________________________________   INITIAL IMPRESSION / ASSESSMENT AND PLAN / ED COURSE  Pertinent labs & imaging results that were available during my care of the patient were reviewed by me and considered in my medical decision making (see chart for details).  Patient was treated with Septra DS twice a day for 10 days as the original wet prep did not show any Trichomonas or clue cells. The patient's  urinalysis showed WBCs to numerous to count along with some rare bacteria. However later in the afternoon it was noted that her wet prep now showed trichomonas present along with clue cells. Patient was called and Flagyl 500 mg twice a day for 7 days was called into [facility] on [address]. Patient was encouraged to follow-up with her OB/GYN. Again patient denies breast-feeding. ____________________________________________   FINAL CLINICAL IMPRESSION(S) / ED DIAGNOSES  Final diagnoses:  Acute UTI (urinary tract infection)  Trichomonal vaginitis  BV (bacterial vaginosis)      NEW MEDICATIONS STARTED DURING THIS VISIT:  Discharge Medication List as of 03/09/2016  2:19 PM    START taking these medications   Details  sulfamethoxazole-trimethoprim (BACTRIM DS,SEPTRA DS) 800-160 MG tablet Take 1 tablet by mouth 2 (two) times daily., Starting 03/09/2016, Until  Discontinued, Print         Note:  This document was prepared using Dragon voice recognition software and may include unintentional dictation errors.    Tommi RumpsRhonda L Nuriya Stuck, PA-C 03/09/16 1516  Minna AntisKevin Paduchowski, MD 03/10/16 2234

## 2016-03-09 NOTE — ED Notes (Signed)
Pt to ed with c/o swelling and itching to vaginal area since Monday.  Pt reports recent vaginal delivery on may 4th.  Pt denies tearing during delivery.  [id number].  Pt also reports bleeding as well.

## 2016-03-10 LAB — URINE CULTURE: Special Requests: NORMAL

## 2016-03-25 ENCOUNTER — Encounter: Payer: Self-pay | Admitting: Emergency Medicine

## 2016-03-25 ENCOUNTER — Emergency Department
Admission: EM | Admit: 2016-03-25 | Discharge: 2016-03-25 | Disposition: A | Discharge status: Home / Self Care | Payer: Medicaid Other | Attending: Emergency Medicine | Admitting: Emergency Medicine

## 2016-03-25 DIAGNOSIS — Z5321 Procedure and treatment not carried out due to patient leaving prior to being seen by health care provider: Secondary | ICD-10-CM | POA: Insufficient documentation

## 2016-03-25 DIAGNOSIS — R21 Rash and other nonspecific skin eruption: Secondary | ICD-10-CM | POA: Diagnosis present

## 2016-03-25 DIAGNOSIS — Z87891 Personal history of nicotine dependence: Secondary | ICD-10-CM | POA: Diagnosis not present

## 2016-03-25 MED ORDER — DIPHENHYDRAMINE HCL 25 MG PO CAPS
50.0000 mg | ORAL_CAPSULE | Freq: Once | ORAL | Status: AC
  Administered 2016-03-25: 50 mg via ORAL

## 2016-03-25 MED ORDER — DIPHENHYDRAMINE HCL 25 MG PO CAPS
ORAL_CAPSULE | ORAL | Status: AC
  Filled 2016-03-25: qty 2

## 2016-03-25 NOTE — ED Notes (Signed)
Pt with red raised itchy rash to bilateral forearms, thighs and chest. Pt states rash appeared on thighs yesterday and has spread to arms and chest. No resp distress noted.no facial swelling noted.

## 2016-09-21 ENCOUNTER — Emergency Department: Payer: Medicaid Other

## 2016-09-21 ENCOUNTER — Encounter: Payer: Self-pay | Admitting: Emergency Medicine

## 2016-09-21 ENCOUNTER — Emergency Department
Admission: EM | Admit: 2016-09-21 | Discharge: 2016-09-21 | Discharge status: Against Medical Advice | Payer: Medicaid Other | Attending: Emergency Medicine | Admitting: Emergency Medicine

## 2016-09-21 DIAGNOSIS — O0281 Inappropriate change in quantitative human chorionic gonadotropin (hCG) in early pregnancy: Secondary | ICD-10-CM | POA: Diagnosis not present

## 2016-09-21 DIAGNOSIS — O26899 Other specified pregnancy related conditions, unspecified trimester: Secondary | ICD-10-CM | POA: Diagnosis present

## 2016-09-21 DIAGNOSIS — R519 Headache, unspecified: Secondary | ICD-10-CM

## 2016-09-21 DIAGNOSIS — R51 Headache: Secondary | ICD-10-CM | POA: Insufficient documentation

## 2016-09-21 DIAGNOSIS — Z791 Long term (current) use of non-steroidal anti-inflammatories (NSAID): Secondary | ICD-10-CM | POA: Insufficient documentation

## 2016-09-21 DIAGNOSIS — Z3A00 Weeks of gestation of pregnancy not specified: Secondary | ICD-10-CM | POA: Diagnosis not present

## 2016-09-21 DIAGNOSIS — Z87891 Personal history of nicotine dependence: Secondary | ICD-10-CM | POA: Insufficient documentation

## 2016-09-21 LAB — COMPREHENSIVE METABOLIC PANEL
ALT: 24 U/L (ref 14–54)
AST: 16 U/L (ref 15–41)
Albumin: 4.3 g/dL (ref 3.5–5.0)
Alkaline Phosphatase: 62 U/L (ref 38–126)
Anion gap: 7 (ref 5–15)
BUN: 10 mg/dL (ref 6–20)
CHLORIDE: 102 mmol/L (ref 101–111)
CO2: 24 mmol/L (ref 22–32)
Calcium: 9.5 mg/dL (ref 8.9–10.3)
Creatinine, Ser: 0.69 mg/dL (ref 0.44–1.00)
Glucose, Bld: 85 mg/dL (ref 65–99)
POTASSIUM: 4 mmol/L (ref 3.5–5.1)
SODIUM: 133 mmol/L — AB (ref 135–145)
Total Bilirubin: 0.3 mg/dL (ref 0.3–1.2)
Total Protein: 8.7 g/dL — ABNORMAL HIGH (ref 6.5–8.1)

## 2016-09-21 LAB — CBC WITH DIFFERENTIAL/PLATELET
BASOS ABS: 0.1 10*3/uL (ref 0–0.1)
Basophils Relative: 1 %
EOS ABS: 0.2 10*3/uL (ref 0–0.7)
EOS PCT: 3 %
HCT: 41.6 % (ref 35.0–47.0)
Hemoglobin: 14.1 g/dL (ref 12.0–16.0)
LYMPHS PCT: 24 %
Lymphs Abs: 2 10*3/uL (ref 1.0–3.6)
MCH: 29.5 pg (ref 26.0–34.0)
MCHC: 33.8 g/dL (ref 32.0–36.0)
MCV: 87.3 fL (ref 80.0–100.0)
MONO ABS: 0.7 10*3/uL (ref 0.2–0.9)
Monocytes Relative: 9 %
Neutro Abs: 5.4 10*3/uL (ref 1.4–6.5)
Neutrophils Relative %: 63 %
PLATELETS: 289 10*3/uL (ref 150–440)
RBC: 4.76 MIL/uL (ref 3.80–5.20)
RDW: 13 % (ref 11.5–14.5)
WBC: 8.5 10*3/uL (ref 3.6–11.0)

## 2016-09-21 LAB — HCG, QUANTITATIVE, PREGNANCY: HCG, BETA CHAIN, QUANT, S: 91561 m[IU]/mL — AB (ref <5)

## 2016-09-21 LAB — POCT PREGNANCY, URINE: PREG TEST UR: POSITIVE — AB

## 2016-09-21 MED ORDER — BUTALBITAL-APAP-CAFFEINE 50-325-40 MG PO TABS
1.0000 | ORAL_TABLET | Freq: Four times a day (QID) | ORAL | 0 refills | Status: AC | PRN
Start: 2016-09-21 — End: 2017-09-21

## 2016-09-21 MED ORDER — KETOROLAC TROMETHAMINE 30 MG/ML IJ SOLN
30.0000 mg | Freq: Once | INTRAMUSCULAR | Status: AC
  Administered 2016-09-21: 30 mg via INTRAVENOUS
  Filled 2016-09-21: qty 1

## 2016-09-21 MED ORDER — METOCLOPRAMIDE HCL 5 MG/ML IJ SOLN
10.0000 mg | Freq: Once | INTRAMUSCULAR | Status: AC
  Administered 2016-09-21: 10 mg via INTRAVENOUS
  Filled 2016-09-21 (×2): qty 2

## 2016-09-21 MED ORDER — SODIUM CHLORIDE 0.9 % IV SOLN
Freq: Once | INTRAVENOUS | Status: AC
  Administered 2016-09-21: 16:00:00 via INTRAVENOUS

## 2016-09-21 NOTE — ED Provider Notes (Signed)
[facility] Emergency Department Provider Note        Time seen: ----------------------------------------- 3:32 PM on 09/21/2016 -----------------------------------------    I have reviewed the triage vital signs and the nursing notes.   HISTORY  Chief Complaint Headache    HPI Emily Peterson is a 25 y.o. female who presents to ER for headache that has been persistent for the last week. Patient states headache started with a upper respiratory illness but has persisted and not gone away. Patient states is deep within her head, she denies lites or sounds bothering her, denies fevers or persistent viral symptoms. Currently the pain is 10 of 10.   Past Medical History:  Diagnosis Date  . Medical history non-contributory     Patient Active Problem List   Diagnosis Date Noted  . Amniotic fluid leaking 02/23/2016  . Postpartum care following vaginal delivery 02/15/2016    Past Surgical History:  Procedure Laterality Date  . NO PAST SURGERIES      Allergies Patient has no known allergies.  Social History Social History  Substance Use Topics  . Smoking status: Former Games developermoker  . Smokeless tobacco: Never Used  . Alcohol use No    Review of Systems Constitutional: Negative for fever. Cardiovascular: Negative for chest pain. Respiratory: Negative for shortness of breath. Gastrointestinal: Negative for abdominal pain, vomiting and diarrhea. Genitourinary: Negative for dysuria. Musculoskeletal: Negative for back pain. Skin: Negative for rash. Neurological: Positive for headache  10-point ROS otherwise negative.  ____________________________________________   PHYSICAL EXAM:  VITAL SIGNS: ED Triage Vitals  Enc Vitals Group     BP 09/21/16 1306 118/68     Pulse Rate 09/21/16 1306 94     Resp 09/21/16 1306 16     Temp 09/21/16 1306 97.6 F (36.4 C)     Temp Source 09/21/16 1306 Oral     SpO2 09/21/16 1306 100 %     Weight 09/21/16  1307 180 lb (81.6 kg)     Height 09/21/16 1307 5\' 4"  (1.626 m)     Head Circumference --      Peak Flow --      Pain Score 09/21/16 1307 10     Pain Loc --      Pain Edu? --      Excl. in GC? --    Constitutional: Alert and oriented. Well appearing and in no distress. Eyes: Conjunctivae are normal. PERRL. Normal extraocular movements. ENT   Head: Normocephalic and atraumatic.   Nose: No congestion/rhinnorhea.   Mouth/Throat: Mucous membranes are moist.   Neck: No stridor. Cardiovascular: Normal rate, regular rhythm. No murmurs, rubs, or gallops. Respiratory: Normal respiratory effort without tachypnea nor retractions. Breath sounds are clear and equal bilaterally. No wheezes/rales/rhonchi. Gastrointestinal: Soft and nontender. Normal bowel sounds Musculoskeletal: Nontender with normal range of motion in all extremities. No lower extremity tenderness nor edema. Neurologic:  Normal speech and language. No gross focal neurologic deficits are appreciated.  Skin:  Skin is warm, dry and intact. No rash noted. Psychiatric: Mood and affect are normal. Speech and behavior are normal.  ____________________________________________  ED COURSE:  Pertinent labs & imaging results that were available during my care of the patient were reviewed by me and considered in my medical decision making (see chart for details). Clinical Course   Patient is in no acute distress, clinically does not appear to be in pain. However given her symptoms we will assess with labs and imaging. We will also give her IV headache  cocktail.  Procedures ____________________________________________   LABS (pertinent positives/negatives)  Labs Reviewed  COMPREHENSIVE METABOLIC PANEL - Abnormal; Notable for the following:       Result Value   Sodium 133 (*)    Total Protein 8.7 (*)    All other components within normal limits  CBC WITH DIFFERENTIAL/PLATELET  POC URINE PREG, ED   ____________________________________________  FINAL ASSESSMENT AND PLAN  Headache  Plan: Patient with labs as dictated above. Patient is in no distress, clinically non-concerning for severe headache etiology. Patient could not stay to have brain CT imaging due to babysitter issues. She is leaving prior to treatment completion at this time.   Emily FilbertWilliams, Lannie Yusuf E, MD   Note: This dictation was prepared with Dragon dictation. Any transcriptional errors that result from this process are unintentional    Emily FilbertJonathan E Casee Knepp, MD 09/21/16 [phone]

## 2016-09-21 NOTE — ED Triage Notes (Signed)
Pt comes into the ED via POV c/o headache that has been persistent for over 1 week.  Patient presents with no history of migraines and states that she has no congestion, cough, or fevers.  Patient presents in NAD at this time and with no neuro disruptions.

## 2016-09-22 NOTE — ED Notes (Signed)
CT staff notified of patient's pregnancy and MD plan to shield patient.

## 2016-09-22 NOTE — ED Notes (Signed)
MD notified of patient's positive pregnancy test.  MD reported that he still feels that patient should get CT scan but be shielded with lead and he states that he will discuss this with patient.

## 2016-09-22 NOTE — ED Notes (Signed)
Patient states that her babysitter is here and that she cannot wait for her CT results but must leave at this time.  MD notified.

## 2016-10-10 LAB — HM PAP SMEAR: HM Pap smear: NEGATIVE

## 2016-12-19 ENCOUNTER — Encounter: Payer: Self-pay | Admitting: Medical Oncology

## 2016-12-19 ENCOUNTER — Emergency Department
Admission: EM | Admit: 2016-12-19 | Discharge: 2016-12-19 | Disposition: A | Discharge status: Home / Self Care | Payer: Medicaid Other | Attending: Student in an Organized Health Care Education/Training Program | Admitting: Student in an Organized Health Care Education/Training Program

## 2016-12-19 DIAGNOSIS — J029 Acute pharyngitis, unspecified: Secondary | ICD-10-CM | POA: Insufficient documentation

## 2016-12-19 DIAGNOSIS — R05 Cough: Secondary | ICD-10-CM | POA: Diagnosis not present

## 2016-12-19 DIAGNOSIS — Z87891 Personal history of nicotine dependence: Secondary | ICD-10-CM | POA: Diagnosis not present

## 2016-12-19 DIAGNOSIS — R69 Illness, unspecified: Secondary | ICD-10-CM

## 2016-12-19 DIAGNOSIS — R509 Fever, unspecified: Secondary | ICD-10-CM | POA: Diagnosis not present

## 2016-12-19 DIAGNOSIS — J111 Influenza due to unidentified influenza virus with other respiratory manifestations: Secondary | ICD-10-CM

## 2016-12-19 DIAGNOSIS — M791 Myalgia: Secondary | ICD-10-CM | POA: Diagnosis not present

## 2016-12-19 MED ORDER — ALBUTEROL SULFATE (2.5 MG/3ML) 0.083% IN NEBU
2.5000 mg | INHALATION_SOLUTION | Freq: Once | RESPIRATORY_TRACT | Status: AC
  Administered 2016-12-19: 2.5 mg via RESPIRATORY_TRACT
  Filled 2016-12-19: qty 3

## 2016-12-19 MED ORDER — IBUPROFEN 600 MG PO TABS
600.0000 mg | ORAL_TABLET | Freq: Once | ORAL | Status: AC
  Administered 2016-12-19: 600 mg via ORAL
  Filled 2016-12-19: qty 1

## 2016-12-19 MED ORDER — ALBUTEROL SULFATE HFA 108 (90 BASE) MCG/ACT IN AERS: 2.0000 | INHALATION_SPRAY | Freq: Four times a day (QID) | RESPIRATORY_TRACT | 2 refills | Status: DC | PRN

## 2016-12-19 NOTE — ED Provider Notes (Signed)
[facility] Emergency Department Provider Note  ____________________________________________  Time seen: Approximately 8:45 AM  I have reviewed the triage vital signs and the nursing notes.   HISTORY  Chief Complaint Generalized Body Aches and Sore Throat   HPI Emily Peterson is a 26 y.o. female who presents to the emergency department for evaluation of body aches, fever, cough, sore throat, and earache since yesterday. She has not taken any over-the-counter medications since the onset of symptoms. She does smoke cigarettes, but otherwise denies chronic illness.   Past Medical History:  Diagnosis Date  . Medical history non-contributory     Patient Active Problem List   Diagnosis Date Noted  . Amniotic fluid leaking 02/23/2016  . Postpartum care following vaginal delivery 02/15/2016    Past Surgical History:  Procedure Laterality Date  . NO PAST SURGERIES      Prior to Admission medications   Medication Sig Start Date End Date Taking? Authorizing Provider  albuterol (PROVENTIL HFA;VENTOLIN HFA) 108 (90 Base) MCG/ACT inhaler Inhale 2 puffs into the lungs every 6 (six) hours as needed for wheezing or shortness of breath. 12/19/16   Chinita Pester, FNP  butalbital-acetaminophen-caffeine (FIORICET, ESGIC) 50-325-40 MG tablet Take 1-2 tablets by mouth every 6 (six) hours as needed for headache. 09/21/16 09/21/17  Emily Filbert, MD  ibuprofen (ADVIL,MOTRIN) 600 MG tablet Take 1 tablet (600 mg total) by mouth every 6 (six) hours as needed for moderate pain. 02/25/16   Farrel Conners, CNM  Iron-FA-B Cmp-C-Biot-Probiotic (FUSION PLUS) CAPS Take 1 capsule by mouth daily. 02/25/16   Farrel Conners, CNM  oxyCODONE-acetaminophen (PERCOCET/ROXICET) 5-325 MG tablet Take 1-2 tablets by mouth every 4 (four) hours as needed for moderate pain or severe pain (pain scale 4-7). 02/25/16   Farrel Conners, CNM  Prenatal Vit-Fe Fumarate-FA (MULTIVITAMIN-PRENATAL)  27-0.8 MG TABS tablet Take 1 tablet by mouth daily at 12 noon.    Historical Provider, MD  sulfamethoxazole-trimethoprim (BACTRIM DS,SEPTRA DS) 800-160 MG tablet Take 1 tablet by mouth 2 (two) times daily. 03/09/16   Tommi Rumps, PA-C    Allergies Patient has no known allergies.  Family History  Problem Relation Age of Onset  . Diabetes Father     Social History Social History  Substance Use Topics  . Smoking status: Former Games developer  . Smokeless tobacco: Never Used  . Alcohol use No    Review of Systems Constitutional: Positive for fever/chills ENT: Positive for sore throat. Cardiovascular: Denies chest pain. Respiratory: Negative for shortness of breath. Positive for cough. Gastrointestinal: Negative for nausea,  no vomiting.  Negative for diarrhea.  Musculoskeletal: Positive for body aches Skin: Negative for rash. Neurological: Negative for headaches ____________________________________________   PHYSICAL EXAM:  VITAL SIGNS: ED Triage Vitals  Enc Vitals Group     BP 12/19/16 0800 131/70     Pulse Rate 12/19/16 0800 (!) 110     Resp 12/19/16 0800 18     Temp 12/19/16 0800 98.6 F (37 C)     Temp Source 12/19/16 0800 Oral     SpO2 12/19/16 0800 97 %     Weight 12/19/16 0757 180 lb (81.6 kg)     Height 12/19/16 0757 5\' 5"  (1.651 m)     Head Circumference --      Peak Flow --      Pain Score 12/19/16 0757 10     Pain Loc --      Pain Edu? --      Excl. in GC? --  Constitutional: Alert and oriented. Acutely ill appearing and in no acute distress. Eyes: Conjunctivae are normal. EOMI. Ears: Bilateral TM normal without loss of light reflex. Nose: No congestion noted; no rhinnorhea. Mouth/Throat: Mucous membranes are moist.  Oropharynx mildly erythematous. Tonsils 2+ without exudate. Neck: No stridor.  Lymphatic: Bilateral anterior cervical lymphadenopathy. Cardiovascular: Normal rate, regular rhythm. Good peripheral circulation. Respiratory: Normal  respiratory effort.  No retractions. Faint, expiratory wheezes noted throughout. Gastrointestinal: Soft and nontender.  Musculoskeletal: FROM x 4 extremities.  Neurologic:  Normal speech and language.  Skin:  Skin is warm, dry and intact. No rash noted. Psychiatric: Mood and affect are normal. Speech and behavior are normal.  ____________________________________________   LABS (all labs ordered are listed, but only abnormal results are displayed)  Labs Reviewed - No data to display ____________________________________________  EKG  Not indicated ____________________________________________  RADIOLOGY  Not indicated ____________________________________________   PROCEDURES  Procedure(s) performed: None  Critical Care performed: No ____________________________________________   INITIAL IMPRESSION / ASSESSMENT AND PLAN / ED COURSE  26 year old female presenting to the emergency department for influenza-like illness. She will be treated symptomatically and advised to take Tylenol and ibuprofen, and encourage rest and fluids.  Patient instructed to follow-up with primary care provider of choice for symptoms that are not improving over the next few days. Also instructed to return to the emergency department for symptoms that change or worsen if he is unable schedule an appointment.   Pertinent labs & imaging results that were available during my care of the patient were reviewed by me and considered in my medical decision making (see chart for details).  Discharge Medication List as of 12/19/2016  9:39 AM      If controlled substance prescribed during this visit, 12 month history viewed on the [place] prior to issuing an initial prescription for Schedule II or II opiod. ____________________________________________   FINAL CLINICAL IMPRESSION(S) / ED DIAGNOSES  Final diagnoses:  Influenza-like illness    Note:  This document was prepared using Dragon voice recognition  software and may include unintentional dictation errors.     Chinita PesterCari B Daiden Coltrane, FNP 12/19/16 1100    Willy EddyPatrick Robinson, MD 12/20/16 Salley [id number]

## 2016-12-19 NOTE — ED Notes (Signed)
Pt to ed with c/o sore throat and body aches since yesterday.  Denies difficulty breathing.

## 2016-12-19 NOTE — ED Triage Notes (Signed)
Pt reports body aches, sore throat and chills that began yesterday.

## 2018-10-03 ENCOUNTER — Emergency Department: Payer: Self-pay

## 2018-10-03 ENCOUNTER — Emergency Department
Admission: EM | Admit: 2018-10-03 | Discharge: 2018-10-03 | Disposition: A | Discharge status: Home / Self Care | Payer: Self-pay | Attending: Emergency Medicine | Admitting: Emergency Medicine

## 2018-10-03 ENCOUNTER — Other Ambulatory Visit: Payer: Self-pay

## 2018-10-03 ENCOUNTER — Encounter: Payer: Self-pay | Admitting: Emergency Medicine

## 2018-10-03 DIAGNOSIS — J209 Acute bronchitis, unspecified: Secondary | ICD-10-CM | POA: Insufficient documentation

## 2018-10-03 DIAGNOSIS — F17210 Nicotine dependence, cigarettes, uncomplicated: Secondary | ICD-10-CM | POA: Insufficient documentation

## 2018-10-03 MED ORDER — ACETAMINOPHEN 500 MG PO TABS
ORAL_TABLET | ORAL | Status: AC
  Filled 2018-10-03: qty 2

## 2018-10-03 MED ORDER — ALBUTEROL SULFATE HFA 108 (90 BASE) MCG/ACT IN AERS: 2.0000 | INHALATION_SPRAY | Freq: Four times a day (QID) | RESPIRATORY_TRACT | 2 refills | Status: DC | PRN

## 2018-10-03 MED ORDER — IPRATROPIUM-ALBUTEROL 0.5-2.5 (3) MG/3ML IN SOLN
3.0000 mL | Freq: Once | RESPIRATORY_TRACT | Status: AC
  Administered 2018-10-03: 3 mL via RESPIRATORY_TRACT
  Filled 2018-10-03: qty 3

## 2018-10-03 MED ORDER — PREDNISONE 10 MG PO TABS: ORAL_TABLET | ORAL | 0 refills | Status: DC

## 2018-10-03 MED ORDER — AZITHROMYCIN 250 MG PO TABS: ORAL_TABLET | ORAL | 0 refills | Status: DC

## 2018-10-03 MED ORDER — ACETAMINOPHEN 500 MG PO TABS
1000.0000 mg | ORAL_TABLET | Freq: Once | ORAL | Status: AC
  Administered 2018-10-03: 1000 mg via ORAL

## 2018-10-03 MED ORDER — DEXAMETHASONE SODIUM PHOSPHATE 10 MG/ML IJ SOLN
10.0000 mg | Freq: Once | INTRAMUSCULAR | Status: AC
  Administered 2018-10-03: 10 mg via INTRAMUSCULAR
  Filled 2018-10-03: qty 1

## 2018-10-03 NOTE — Discharge Instructions (Addendum)
Follow-up with your primary care provider if any continued problems or concerns.  Begin taking medication only as directed.  The prednisone is for 5 days.  Zithromax is for 5 days but will stay in your body for 10.  And use the inhaler as needed for shortness of breath or wheezing.  You may take Tylenol with this medication if needed for body aches or fever.  Discontinue smoking.  Increase fluids.

## 2018-10-03 NOTE — ED Notes (Signed)
See triage note  Presents with body aches and subjective fever for the past few days Low grade fever on arrival   States her cough has been prod occassionally

## 2018-10-03 NOTE — ED Triage Notes (Signed)
Patient ambulatory to triage with steady gait, without difficulty or distress noted; pt st x wk having body aches, prod cough green sputum

## 2018-10-03 NOTE — ED Provider Notes (Signed)
[facility] Emergency Department Provider Note   ____________________________________________   None    (approximate)  I have reviewed the triage vital signs and the nursing notes.   HISTORY  Chief Complaint Cough  HPI Emily Peterson is a 27 y.o. female presents to the ED with 4 days of low-grade temp, body aches, productive cough.  Patient states this morning she is having some shortness of breath.  Patient denies any previous respiratory problems such as bronchitis or pneumonia.  She denies any asthma but states that she has been using an albuterol inhaler that belongs to her boyfriend.  Patient denies any nausea, vomiting or diarrhea.  Patient does continue to smoke half pack cigarettes per day.  She rates her pain as a 10/10.  Past Medical History:  Diagnosis Date  . Medical history non-contributory     Patient Active Problem List   Diagnosis Date Noted  . Amniotic fluid leaking 02/23/2016  . Postpartum care following vaginal delivery 02/15/2016    Past Surgical History:  Procedure Laterality Date  . NO PAST SURGERIES      Prior to Admission medications   Medication Sig Start Date End Date Taking? Authorizing Provider  albuterol (PROVENTIL HFA;VENTOLIN HFA) 108 (90 Base) MCG/ACT inhaler Inhale 2 puffs into the lungs every 6 (six) hours as needed for wheezing or shortness of breath. 10/03/18   Tommi RumpsSummers,  L, PA-C  azithromycin (ZITHROMAX Z-PAK) 250 MG tablet Take 2 tablets (500 mg) on  Day 1,  followed by 1 tablet (250 mg) once daily on Days 2 through 5. 10/03/18   Tommi RumpsSummers,  L, PA-C  predniSONE (DELTASONE) 10 MG tablet Take 3 tablets each day for 5 days 10/03/18   Tommi RumpsSummers,  L, PA-C    Allergies Patient has no known allergies.  Family History  Problem Relation Age of Onset  . Diabetes Father     Social History Social History   Tobacco Use  . Smoking status: Former Games developermoker  . Smokeless tobacco: Never Used  Substance  Use Topics  . Alcohol use: No  . Drug use: No    Review of Systems Constitutional: As of subjective  fever/chills Eyes: No visual changes. ENT: No sore throat. Cardiovascular: Denies chest pain. Respiratory: Denies shortness of breath.  Positive productive cough. Gastrointestinal: No abdominal pain.  No nausea, no vomiting.  No diarrhea.   Musculoskeletal: Positive for body aches. Skin: Negative for rash. Neurological: Negative for headaches, focal weakness or numbness. ___________________________________________   PHYSICAL EXAM:  VITAL SIGNS: ED Triage Vitals [10/03/18 0659]  Enc Vitals Group     BP      Pulse      Resp      Temp      Temp src      SpO2      Weight 220 lb (99.8 kg)     Height 5\' 3"  (1.6 m)     Head Circumference      Peak Flow      Pain Score 10     Pain Loc      Pain Edu?      Excl. in GC?    Constitutional: Alert and oriented. Well appearing and in no acute distress. Eyes: Conjunctivae are normal.  Head: Atraumatic. Nose: Mild congestion/rhinnorhea.  TMs are dull bilaterally. Mouth/Throat: Mucous membranes are moist.  Oropharynx non-erythematous. Neck: No stridor.   Hematological/Lymphatic/Immunilogical: No cervical lymphadenopathy. Cardiovascular: Normal rate, regular rhythm. Grossly normal heart sounds.  Good peripheral circulation. Respiratory: Normal  respiratory effort.  No retractions. Lungs CTAB. Gastrointestinal: Soft and nontender. No distention. Musculoskeletal: Moves upper and lower extremities without difficulty.  Normal gait was noted. Neurologic:  Normal speech and language. No gross focal neurologic deficits are appreciated. Skin:  Skin is warm, dry and intact. No rash noted. Psychiatric: Mood and affect are normal. Speech and behavior are normal.  ____________________________________________   LABS (all labs ordered are listed, but only abnormal results are displayed)  Labs Reviewed - No data to  display  RADIOLOGY  Official radiology report(s): Dg Chest 2 View  Result Date: 10/03/2018 CLINICAL DATA:  Cough, wheezing, fever and body aches for 3 days. EXAM: CHEST - 2 VIEW COMPARISON:  None. FINDINGS: The cardiac silhouette, mediastinal and hilar contours are within normal limits. Mild peribronchial thickening and slight increased interstitial markings suggesting bronchitis. No infiltrates, edema or effusions. No worrisome pulmonary lesions. The bony thorax is intact. IMPRESSION: Findings suggest bronchitis.  No infiltrates or effusions. Electronically Signed   By: Rudie Meyer M.D.   On: 10/03/2018 08:40    ____________________________________________   PROCEDURES  Procedure(s) performed: None  Procedures  Critical Care performed: No  ____________________________________________   INITIAL IMPRESSION / ASSESSMENT AND PLAN / ED COURSE  As part of my medical decision making, I reviewed the following data within the electronic MEDICAL RECORD NUMBER Notes from prior ED visits and [place] Controlled Substance Database  Patient presents to the ED with complaint of body aches, productive cough and subjective fever.  She has been taking over-the-counter medication without any relief.  She is also been using her boyfriend's albuterol to help with cough and shortness of breath.  She denies any past history of bronchitis or pneumonia.  She continues to smoke 1/2 pack cigarettes per day.  Physical exam was concerning for bronchitis and x-ray confirms.  Patient was given DuoNeb treatment x2 in the ED and improved greatly.  She also received Decadron 10 mg IM.  Patient was discharged with prescription for an albuterol inhaler, prednisone, and Zithromax.  She is to take Tylenol if needed for fever and was told to increase fluids.  She is to follow-up with her PCP if any continued problems.  ____________________________________________   FINAL CLINICAL IMPRESSION(S) / ED DIAGNOSES  Final diagnoses:   Acute bronchitis, unspecified organism  Cigarette smoker     ED Discharge Orders         Ordered    albuterol (PROVENTIL HFA;VENTOLIN HFA) 108 (90 Base) MCG/ACT inhaler  Every 6 hours PRN     10/03/18 0927    azithromycin (ZITHROMAX Z-PAK) 250 MG tablet     10/03/18 0927    predniSONE (DELTASONE) 10 MG tablet     10/03/18 4098           Note:  This document was prepared using Dragon voice recognition software and may include unintentional dictation errors.    Tommi Rumps, PA-C 10/03/18 1109    Schaevitz, Myra Rude, MD 10/03/18 [phone]

## 2019-03-12 DIAGNOSIS — Z8742 Personal history of other diseases of the female genital tract: Secondary | ICD-10-CM | POA: Insufficient documentation

## 2019-03-12 DIAGNOSIS — F17200 Nicotine dependence, unspecified, uncomplicated: Secondary | ICD-10-CM | POA: Insufficient documentation

## 2019-04-15 DIAGNOSIS — E669 Obesity, unspecified: Secondary | ICD-10-CM | POA: Insufficient documentation

## 2019-07-30 ENCOUNTER — Emergency Department: Payer: Medicaid Other

## 2019-07-30 ENCOUNTER — Encounter: Payer: Self-pay | Admitting: Emergency Medicine

## 2019-07-30 ENCOUNTER — Other Ambulatory Visit: Payer: Self-pay

## 2019-07-30 ENCOUNTER — Emergency Department
Admission: EM | Admit: 2019-07-30 | Discharge: 2019-07-30 | Disposition: A | Discharge status: Home / Self Care | Payer: Medicaid Other | Attending: Emergency Medicine | Admitting: Emergency Medicine

## 2019-07-30 DIAGNOSIS — O99891 Other specified diseases and conditions complicating pregnancy: Secondary | ICD-10-CM | POA: Insufficient documentation

## 2019-07-30 DIAGNOSIS — Z3493 Encounter for supervision of normal pregnancy, unspecified, third trimester: Secondary | ICD-10-CM

## 2019-07-30 DIAGNOSIS — R05 Cough: Secondary | ICD-10-CM | POA: Diagnosis not present

## 2019-07-30 DIAGNOSIS — Z87891 Personal history of nicotine dependence: Secondary | ICD-10-CM | POA: Diagnosis not present

## 2019-07-30 DIAGNOSIS — Z79899 Other long term (current) drug therapy: Secondary | ICD-10-CM | POA: Insufficient documentation

## 2019-07-30 DIAGNOSIS — Z3A26 26 weeks gestation of pregnancy: Secondary | ICD-10-CM | POA: Insufficient documentation

## 2019-07-30 DIAGNOSIS — Z20828 Contact with and (suspected) exposure to other viral communicable diseases: Secondary | ICD-10-CM | POA: Insufficient documentation

## 2019-07-30 DIAGNOSIS — R0981 Nasal congestion: Secondary | ICD-10-CM | POA: Insufficient documentation

## 2019-07-30 DIAGNOSIS — J209 Acute bronchitis, unspecified: Secondary | ICD-10-CM

## 2019-07-30 DIAGNOSIS — Z7982 Long term (current) use of aspirin: Secondary | ICD-10-CM | POA: Insufficient documentation

## 2019-07-30 DIAGNOSIS — O24415 Gestational diabetes mellitus in pregnancy, controlled by oral hypoglycemic drugs: Secondary | ICD-10-CM | POA: Diagnosis not present

## 2019-07-30 HISTORY — DX: Gestational diabetes mellitus in pregnancy, unspecified control: O24.419

## 2019-07-30 MED ORDER — ALBUTEROL SULFATE HFA 108 (90 BASE) MCG/ACT IN AERS: 2.0000 | INHALATION_SPRAY | RESPIRATORY_TRACT | 0 refills | Status: DC | PRN

## 2019-07-30 MED ORDER — PREDNISONE 20 MG PO TABS: 40.0000 mg | ORAL_TABLET | Freq: Every day | ORAL | 0 refills | Status: DC

## 2019-07-30 NOTE — ED Triage Notes (Signed)
Denies running any fevers, had bronchitis in January of this past year and states this episode feels similar.

## 2019-07-30 NOTE — ED Provider Notes (Signed)
[facility] Emergency Department Provider Note  ____________________________________________  Time seen: Approximately 8:10 AM  I have reviewed the triage vital signs and the nursing notes.   HISTORY  Chief Complaint Cough, Chest Pain, and Nasal Congestion    HPI Emily Peterson is a 28 y.o. female with a history of gestational diabetes, currently [redacted] weeks pregnant who comes the ED complaining of nonproductive cough, congestion for the past 3 days, started after taking her kids to a park.  Denies shortness of breath or significant chest pain although with intermittent forceful coughing she does have some chest discomfort that is very brief lasting a second at a time.  Nonradiating.  No other aggravating or alleviating factors.  Not exertional, not pleuritic.  No fever.  No vaginal bleeding, leakage of fluid, contraction pain.  Normal fetal movements.  Sees [facility] maternal-fetal medicine.  Taking prenatal vitamins and metformin.   No history of DVT or PE.   Past Medical History:  Diagnosis Date  . Gestational diabetes   . Medical history non-contributory      Patient Active Problem List   Diagnosis Date Noted  . Amniotic fluid leaking 02/23/2016  . Postpartum care following vaginal delivery 02/15/2016     Past Surgical History:  Procedure Laterality Date  . NO PAST SURGERIES       Prior to Admission medications   Medication Sig Start Date End Date Taking? Authorizing Provider  albuterol (PROVENTIL HFA) 108 (90 Base) MCG/ACT inhaler Inhale 2 puffs into the lungs every 4 (four) hours as needed for wheezing or shortness of breath. 07/30/19   Carrie Mew, MD  azithromycin (ZITHROMAX Z-PAK) 250 MG tablet Take 2 tablets (500 mg) on  Day 1,  followed by 1 tablet (250 mg) once daily on Days 2 through 5. 10/03/18   Johnn Hai, PA-C  predniSONE (DELTASONE) 20 MG tablet Take 2 tablets (40 mg total) by mouth daily. 07/30/19   Carrie Mew, MD   Prenatal vitamin Aspirin Metformin   Allergies Patient has no known allergies.   Family History  Problem Relation Age of Onset  . Diabetes Father     Social History Social History   Tobacco Use  . Smoking status: Former Research scientist (life sciences)  . Smokeless tobacco: Never Used  Substance Use Topics  . Alcohol use: No  . Drug use: No    Review of Systems  Constitutional:   No fever or chills.  ENT:   No sore throat. No rhinorrhea. Cardiovascular:   No chest pain or syncope. Respiratory:   No dyspnea positive cough. Gastrointestinal:   Negative for abdominal pain, vomiting and diarrhea.  Musculoskeletal:   Negative for focal pain or swelling All other systems reviewed and are negative except as documented above in ROS and HPI.  ____________________________________________   PHYSICAL EXAM:  VITAL SIGNS: ED Triage Vitals  Enc Vitals Group     BP 07/30/19 0734 108/75     Pulse Rate 07/30/19 0728 (!) 109     Resp 07/30/19 0732 20     Temp 07/30/19 0736 98.7 F (37.1 C)     Temp Source 07/30/19 0736 Oral     SpO2 07/30/19 0728 98 %     Weight 07/30/19 0716 228 lb (103.4 kg)     Height 07/30/19 0716 5\' 3"  (1.6 m)     Head Circumference --      Peak Flow --      Pain Score 07/30/19 0716 8     Pain Loc --  Pain Edu? --      Excl. in [place]? --     Vital signs reviewed, nursing assessments reviewed.   Constitutional:   Alert and oriented. Non-toxic appearance. Eyes:   Conjunctivae are normal. EOMI. PERRL. ENT      Head:   Normocephalic and atraumatic.      Nose:   Wearing a mask.      Mouth/Throat:   Wearing a mask.      Neck:   No meningismus. Full ROM. Hematological/Lymphatic/Immunilogical:   No cervical lymphadenopathy. Cardiovascular:   RRR. Symmetric bilateral radial and DP pulses.  No murmurs. Cap refill less than 2 seconds. Respiratory:   Normal respiratory effort without tachypnea/retractions.  Diffuse expiratory wheezing.  Prolonged expiratory phase which is  accentuated by FEV1 maneuver Gastrointestinal:   Soft and nontender.  Gravid, size consistent with dates. There is no CVA tenderness.  No rebound, rigidity, or guarding. Musculoskeletal:   Normal range of motion in all extremities. No joint effusions.  No lower extremity tenderness.  No edema. Neurologic:   Normal speech and language.  Motor grossly intact. No acute focal neurologic deficits are appreciated.  Skin:    Skin is warm, dry and intact. No rash noted.  No petechiae, purpura, or bullae.  ____________________________________________    LABS (pertinent positives/negatives) (all labs ordered are listed, but only abnormal results are displayed) Labs Reviewed  NOVEL CORONAVIRUS, NAA (HOSP ORDER, SEND-OUT TO REF LAB; TAT 18-24 HRS)   ____________________________________________   EKG  Interpreted by me Sinus tachycardia rate 101, normal axis intervals QRS ST segments and T waves.  ____________________________________________    RADIOLOGY  No results found.  ____________________________________________   PROCEDURES Procedures  ____________________________________________    CLINICAL IMPRESSION / ASSESSMENT AND PLAN / ED COURSE  Medications ordered in the ED: Medications - No data to display  Pertinent labs & imaging results that were available during my care of the patient were reviewed by me and considered in my medical decision making (see chart for details).  Emily Peterson was evaluated in Emergency Department on 07/30/2019 for the symptoms described in the history of present illness. She was evaluated in the context of the global COVID-19 pandemic, which necessitated consideration that the patient might be at risk for infection with the SARS-CoV-2 virus that causes COVID-19. Institutional protocols and algorithms that pertain to the evaluation of patients at risk for COVID-19 are in a state of rapid change based on information released by regulatory bodies  including the CDC and federal and state organizations. These policies and algorithms were followed during the patient's care in the ED.   Patient presents with cough, wheezing.  Exam is consistent with bronchitis/bronchospasm.  Doubt pneumonia.  Obtain chest x-ray.  Start on prednisone and albuterol, follow-up with MFM.  Clinical Course as of Jul 29 809  Wed Jul 30, 2019  0805 Chest x-ray image viewed by me, shows no acute disease.  No pneumothorax or evidence of pneumonia.  No pleural effusion or pulmonary edema.  No abdominal free air.   [PS]    Clinical Course User Index [PS] Sharman Cheek, MD     ----------------------------------------- 8:14 AM on 07/30/2019 -----------------------------------------  Stable for discharge home.Considering the patient's symptoms, medical history, and physical examination today, I have low suspicion for ACS, PE, TAD, pneumothorax, carditis, mediastinitis, pneumonia, CHF, or sepsis.    ____________________________________________   FINAL CLINICAL IMPRESSION(S) / ED DIAGNOSES    Final diagnoses:  Acute bronchitis, unspecified organism  Third trimester pregnancy  ED Discharge Orders         Ordered    predniSONE (DELTASONE) 20 MG tablet  Daily     07/30/19 0809    albuterol (PROVENTIL HFA) 108 (90 Base) MCG/ACT inhaler  Every 4 hours PRN     07/30/19 0809          Portions of this note were generated with dragon dictation software. Dictation errors may occur despite best attempts at proofreading.   Sharman CheekStafford, Yehudah Standing, MD 07/30/19 [phone]

## 2019-07-30 NOTE — Discharge Instructions (Addendum)
With the challenges of the current pandemic, I want to help you prepare to have a healthy, safe voting experience. Register to vote by October 9 where you live now and request a mail ballot if needed by using this site: [email or web address] or text vote healthy to the number 34444 on your phone.  You can also go to [employer] information website at [email or web address].

## 2019-07-30 NOTE — ED Triage Notes (Signed)
Pt here with c/o cough and congestion for a few days now, no SHOB, states wheezing at times, intermittent cp (burning) with each cough. NAD.

## 2019-07-31 LAB — NOVEL CORONAVIRUS, NAA (HOSP ORDER, SEND-OUT TO REF LAB; TAT 18-24 HRS): SARS-CoV-2, NAA: NOT DETECTED

## 2019-09-26 ENCOUNTER — Other Ambulatory Visit: Payer: Self-pay

## 2019-09-26 DIAGNOSIS — Z20822 Contact with and (suspected) exposure to covid-19: Secondary | ICD-10-CM

## 2019-09-28 LAB — NOVEL CORONAVIRUS, NAA: SARS-CoV-2, NAA: NOT DETECTED

## 2020-01-09 ENCOUNTER — Other Ambulatory Visit: Payer: Self-pay

## 2020-01-09 ENCOUNTER — Emergency Department: Payer: Medicaid Other

## 2020-01-09 ENCOUNTER — Emergency Department
Admission: EM | Admit: 2020-01-09 | Discharge: 2020-01-09 | Disposition: A | Discharge status: Home / Self Care | Payer: Medicaid Other | Attending: Emergency Medicine | Admitting: Emergency Medicine

## 2020-01-09 ENCOUNTER — Encounter: Payer: Self-pay | Admitting: Emergency Medicine

## 2020-01-09 DIAGNOSIS — J40 Bronchitis, not specified as acute or chronic: Secondary | ICD-10-CM | POA: Insufficient documentation

## 2020-01-09 DIAGNOSIS — J4521 Mild intermittent asthma with (acute) exacerbation: Secondary | ICD-10-CM | POA: Diagnosis not present

## 2020-01-09 DIAGNOSIS — Z87891 Personal history of nicotine dependence: Secondary | ICD-10-CM | POA: Diagnosis not present

## 2020-01-09 DIAGNOSIS — R0602 Shortness of breath: Secondary | ICD-10-CM | POA: Insufficient documentation

## 2020-01-09 DIAGNOSIS — Z20822 Contact with and (suspected) exposure to covid-19: Secondary | ICD-10-CM | POA: Insufficient documentation

## 2020-01-09 DIAGNOSIS — R05 Cough: Secondary | ICD-10-CM | POA: Diagnosis present

## 2020-01-09 LAB — BASIC METABOLIC PANEL
Anion gap: 8 (ref 5–15)
BUN: 9 mg/dL (ref 6–20)
CO2: 26 mmol/L (ref 22–32)
Calcium: 9.2 mg/dL (ref 8.9–10.3)
Chloride: 104 mmol/L (ref 98–111)
Creatinine, Ser: 0.65 mg/dL (ref 0.44–1.00)
GFR calc Af Amer: >60 mL/min (ref >60)
GFR calc non Af Amer: >60 mL/min (ref >60)
Glucose, Bld: 115 mg/dL — ABNORMAL HIGH (ref 70–99)
Potassium: 3.7 mmol/L (ref 3.5–5.1)
Sodium: 138 mmol/L (ref 135–145)

## 2020-01-09 LAB — CBC WITH DIFFERENTIAL/PLATELET
Abs Immature Granulocytes: 0.05 10*3/uL (ref 0.00–0.07)
Basophils Absolute: 0.1 10*3/uL (ref 0.0–0.1)
Basophils Relative: 1 %
Eosinophils Absolute: 0.3 10*3/uL (ref 0.0–0.5)
Eosinophils Relative: 4 %
HCT: 43 % (ref 36.0–46.0)
Hemoglobin: 14.3 g/dL (ref 12.0–15.0)
Immature Granulocytes: 1 %
Lymphocytes Relative: 31 %
Lymphs Abs: 2.5 10*3/uL (ref 0.7–4.0)
MCH: 29.2 pg (ref 26.0–34.0)
MCHC: 33.3 g/dL (ref 30.0–36.0)
MCV: 87.8 fL (ref 80.0–100.0)
Monocytes Absolute: 0.5 10*3/uL (ref 0.1–1.0)
Monocytes Relative: 6 %
Neutro Abs: 4.7 10*3/uL (ref 1.7–7.7)
Neutrophils Relative %: 57 %
Platelets: 243 10*3/uL (ref 150–400)
RBC: 4.9 MIL/uL (ref 3.87–5.11)
RDW: 12.5 % (ref 11.5–15.5)
WBC: 8.1 10*3/uL (ref 4.0–10.5)
nRBC: 0 % (ref 0.0–0.2)

## 2020-01-09 LAB — SARS CORONAVIRUS 2 (TAT 6-24 HRS): SARS Coronavirus 2: NEGATIVE

## 2020-01-09 MED ORDER — PREDNISONE 20 MG PO TABS
60.0000 mg | ORAL_TABLET | Freq: Once | ORAL | Status: AC
  Administered 2020-01-09: 60 mg via ORAL
  Filled 2020-01-09: qty 3

## 2020-01-09 MED ORDER — ALBUTEROL SULFATE (2.5 MG/3ML) 0.083% IN NEBU
2.5000 mg | INHALATION_SOLUTION | Freq: Once | RESPIRATORY_TRACT | Status: AC
  Administered 2020-01-09: 2.5 mg via RESPIRATORY_TRACT
  Filled 2020-01-09: qty 3

## 2020-01-09 MED ORDER — IPRATROPIUM-ALBUTEROL 0.5-2.5 (3) MG/3ML IN SOLN
3.0000 mL | Freq: Once | RESPIRATORY_TRACT | Status: AC
  Administered 2020-01-09: 3 mL via RESPIRATORY_TRACT
  Filled 2020-01-09: qty 3

## 2020-01-09 MED ORDER — ALBUTEROL SULFATE HFA 108 (90 BASE) MCG/ACT IN AERS: 2.0000 | INHALATION_SPRAY | Freq: Four times a day (QID) | RESPIRATORY_TRACT | 1 refills | Status: DC | PRN

## 2020-01-09 MED ORDER — MAGNESIUM SULFATE 2 GM/50ML IV SOLN
2.0000 g | Freq: Once | INTRAVENOUS | Status: AC
  Administered 2020-01-09: 2 g via INTRAVENOUS

## 2020-01-09 MED ORDER — METHYLPREDNISOLONE SODIUM SUCC 125 MG IJ SOLR
125.0000 mg | Freq: Once | INTRAMUSCULAR | Status: DC
  Filled 2020-01-09: qty 2

## 2020-01-09 MED ORDER — SODIUM CHLORIDE 0.9 % IV BOLUS
1000.0000 mL | Freq: Once | INTRAVENOUS | Status: AC
  Administered 2020-01-09: 1000 mL via INTRAVENOUS

## 2020-01-09 MED ORDER — PREDNISONE 20 MG PO TABS: 40.0000 mg | ORAL_TABLET | Freq: Every day | ORAL | 0 refills | Status: AC

## 2020-01-09 MED ORDER — SODIUM CHLORIDE 0.9 % IV BOLUS: 1000.0000 mL | Freq: Once | INTRAVENOUS | Status: DC

## 2020-01-09 MED ORDER — MAGNESIUM SULFATE 2 GM/50ML IV SOLN
2.0000 g | Freq: Once | INTRAVENOUS | Status: DC
  Filled 2020-01-09: qty 50

## 2020-01-09 NOTE — ED Triage Notes (Signed)
Pt presents to ED via POV with c/o SOB, wheezing, CP and sore throat. Pt states symptoms started yesterday, upon arrival pt with noted cough, audible wheezes while standing next to patient and increased RR. Pt states pain in chest and throat 10/10. Pt brought back to room 10 for triage by this RN and EKG obtained.

## 2020-01-09 NOTE — Discharge Instructions (Signed)
Use your albuterol inhaler, 2 puffs every 4 hours when awake, for the next 24 hours  After this, use it every 4 hours as needed for cough and wheezing  Take the prednisone as prescribed  Drink plenty of fluids  It is OK to continue breastfeeding

## 2020-01-09 NOTE — ED Provider Notes (Signed)
[facility] Emergency Department Provider Note  ____________________________________________   First MD Initiated Contact with Patient 01/09/20 0805     (approximate)  I have reviewed the triage vital signs and the nursing notes.   HISTORY  Chief Complaint Respiratory Distress    HPI Emily Peterson is a 29 y.o. female with past medical history as below here with cough, wheezing.  The patient states that over the last 48 hours, she has had progressive worsening dry cough with wheezing.  She has a history of recurrent bronchitis, and has been prescribed steroids as well as an inhaler in the past.  She states that with the weather changes recently, she has began to have progressive worsening wheezing.  This is severely worsened over the last 24 hours.  She is now having shortness of breath with exertion as well as at rest.  She has had audible wheezing over the last 12 hours or so.  She did not have any further inhalers.  She has not formally been diagnosed with asthma, but does smoke as well as had to use prednisone and inhalers in the past.  She is had some chills but no subjective fevers.  No leg swelling.  No calf pain.  No recent immobilization.  She is approximately 8 weeks post delivery, which was uncomplicated.       Past Medical History:  Diagnosis Date  . Gestational diabetes   . Medical history non-contributory     Patient Active Problem List   Diagnosis Date Noted  . Amniotic fluid leaking 02/23/2016  . Postpartum care following vaginal delivery 02/15/2016    Past Surgical History:  Procedure Laterality Date  . NO PAST SURGERIES      Prior to Admission medications   Medication Sig Start Date End Date Taking? Authorizing Provider  albuterol (VENTOLIN HFA) 108 (90 Base) MCG/ACT inhaler Inhale 2 puffs into the lungs every 6 (six) hours as needed for wheezing or shortness of breath. 01/09/20   Shaune Pollack, MD  azithromycin (ZITHROMAX  Z-PAK) 250 MG tablet Take 2 tablets (500 mg) on  Day 1,  followed by 1 tablet (250 mg) once daily on Days 2 through 5. 10/03/18   Tommi Rumps, PA-C  predniSONE (DELTASONE) 20 MG tablet Take 2 tablets (40 mg total) by mouth daily for 5 days. 01/09/20 01/14/20  Shaune Pollack, MD    Allergies Patient has no known allergies.  Family History  Problem Relation Age of Onset  . Diabetes Father     Social History Social History   Tobacco Use  . Smoking status: Former Games developer  . Smokeless tobacco: Never Used  Substance Use Topics  . Alcohol use: No  . Drug use: No    Review of Systems  Review of Systems  Constitutional: Positive for chills and fatigue. Negative for fever.  HENT: Negative for congestion and sore throat.   Eyes: Negative for visual disturbance.  Respiratory: Positive for cough, shortness of breath and wheezing.   Cardiovascular: Negative for chest pain.  Gastrointestinal: Negative for abdominal pain, diarrhea, nausea and vomiting.  Genitourinary: Negative for flank pain.  Musculoskeletal: Negative for back pain and neck pain.  Skin: Negative for rash and wound.  Neurological: Negative for weakness.  All other systems reviewed and are negative.    ____________________________________________  PHYSICAL EXAM:      VITAL SIGNS: ED Triage Vitals  Enc Vitals Group     BP 01/09/20 0802 (!) 133/92     Pulse Rate  01/09/20 0802 (!) 104     Resp 01/09/20 0802 (!) 30     Temp 01/09/20 0802 98.3 F (36.8 C)     Temp Source 01/09/20 0802 Oral     SpO2 01/09/20 0802 97 %     Weight 01/09/20 0801 220 lb (99.8 kg)     Height 01/09/20 0801 5\' 2"  (1.575 m)     Head Circumference --      Peak Flow --      Pain Score 01/09/20 0803 10     Pain Loc --      Pain Edu? --      Excl. in [place]? --      Physical Exam Vitals and nursing note reviewed.  Constitutional:      General: She is not in acute distress.    Appearance: She is well-developed.  HENT:     Head:  Normocephalic and atraumatic.  Eyes:     Conjunctiva/sclera: Conjunctivae normal.  Cardiovascular:     Rate and Rhythm: Regular rhythm. Tachycardia present.     Heart sounds: Normal heart sounds. No murmur. No friction rub.  Pulmonary:     Effort: Pulmonary effort is normal. Tachypnea present. No respiratory distress.     Breath sounds: Examination of the right-upper field reveals wheezing. Examination of the left-upper field reveals wheezing. Examination of the right-middle field reveals wheezing. Examination of the left-middle field reveals wheezing. Examination of the right-lower field reveals wheezing. Examination of the left-lower field reveals wheezing. Wheezing present. No rales.  Abdominal:     General: There is no distension.     Palpations: Abdomen is soft.     Tenderness: There is no abdominal tenderness.  Musculoskeletal:     Cervical back: Neck supple.  Skin:    General: Skin is warm.     Capillary Refill: Capillary refill takes less than 2 seconds.  Neurological:     Mental Status: She is alert and oriented to person, place, and time.     Motor: No abnormal muscle tone.       ____________________________________________   LABS (all labs ordered are listed, but only abnormal results are displayed)  Labs Reviewed  BASIC METABOLIC PANEL - Abnormal; Notable for the following components:      Result Value   Glucose, Bld 115 (*)    All other components within normal limits  SARS CORONAVIRUS 2 (TAT 6-24 HRS)  CBC WITH DIFFERENTIAL/PLATELET    ____________________________________________  EKG: Sinus tachycardia, ventricular rate 101.  PR 131, QRS 90, QTc 435.  No acute ST elevations or depressions.  Nonspecific T wave changes laterally.  No evidence of acute ischemia or infarction ________________________________________  RADIOLOGY All imaging, including plain films, CT scans, and ultrasounds, independently reviewed by me, and interpretations confirmed via formal  radiology reads.  ED MD interpretation:   CXR: Clear  Official radiology report(s): DG Chest Portable 1 View  Result Date: 01/09/2020 CLINICAL DATA:  Shortness of breath, wheezing, chest pain EXAM: PORTABLE CHEST 1 VIEW COMPARISON:  07/30/2019 FINDINGS: The heart size and mediastinal contours are within normal limits. Both lungs are clear. The visualized skeletal structures are unremarkable. IMPRESSION: No acute abnormality of the lungs in AP portable projection. Electronically Signed   By: Eddie Candle M.D.   On: 01/09/2020 09:04    ____________________________________________  PROCEDURES   Procedure(s) performed (including Critical Care):  Procedures  ____________________________________________  INITIAL IMPRESSION / MDM / [place] / ED COURSE  As part of my medical decision  making, I reviewed the following data within the electronic MEDICAL RECORD NUMBER Nursing notes reviewed and incorporated, Old chart reviewed, Notes from prior ED visits, and  Controlled Substance Database       *Elisheba Mcdonnell Nishida was evaluated in Emergency Department on 01/09/2020 for the symptoms described in the history of present illness. She was evaluated in the context of the global COVID-19 pandemic, which necessitated consideration that the patient might be at risk for infection with the SARS-CoV-2 virus that causes COVID-19. Institutional protocols and algorithms that pertain to the evaluation of patients at risk for COVID-19 are in a state of rapid change based on information released by regulatory bodies including the CDC and federal and state organizations. These policies and algorithms were followed during the patient's care in the ED.  Some ED evaluations and interventions may be delayed as a result of limited staffing during the pandemic.*  Clinical Course as of Jan 08 1341  Fri Jan 09, 2020  5934 29 year old female here with cough, wheezing, shortness of breath.  I suspect patient likely  has an underlying component of asthma/reactive airway disease given history of recurrent bronchitis and wheezing episodes requiring intermittent steroids and nebulizers, also with likely component of COPD/tobacco dependence, and ongoing tobacco use.  She has diffuse wheezing but is speaking in full sentences.  Will start aggressive treatment with steroids, nebs, and magnesium.  Goal will be to hopefully discharge given that she has a newborn who she is breast-feeding at home.  Risks and benefits of steroids discussed, and feel the benefits of steroids outweigh the risks.  Otherwise, she has no lower extremity swelling, calf pain, and she is not hypoxic, and I do not suspect PE clinically at this time.  EKG is nonischemic.  No evidence of CHF or postpartum cardiomyopathy.   [CI]  0946 CXR is clear. Sats remain >95% on RA. Will reassess after neb.   [CI]  1000 Excellent response to nebs. Will plan to d/c with oupt management.    [CI]    Clinical Course User Index [CI] Shaune Pollack, MD    Medical Decision Making:  As above. Ambulating w/o hypoxia. Feels better and wants to try outpt management. COVID test sent.  ____________________________________________  FINAL CLINICAL IMPRESSION(S) / ED DIAGNOSES  Final diagnoses:  Bronchitis  Mild intermittent reactive airway disease with acute exacerbation     MEDICATIONS GIVEN DURING THIS VISIT:  Medications  sodium chloride 0.9 % bolus 1,000 mL (has no administration in time range)  ipratropium-albuterol (DUONEB) 0.5-2.5 (3) MG/3ML nebulizer solution 3 mL (3 mLs Nebulization Given 01/09/20 0817)  ipratropium-albuterol (DUONEB) 0.5-2.5 (3) MG/3ML nebulizer solution 3 mL (3 mLs Nebulization Given 01/09/20 0817)  ipratropium-albuterol (DUONEB) 0.5-2.5 (3) MG/3ML nebulizer solution 3 mL (3 mLs Nebulization Given 01/09/20 0817)  predniSONE (DELTASONE) tablet 60 mg (60 mg Oral Given 01/09/20 0944)  albuterol (PROVENTIL) (2.5 MG/3ML) 0.083% nebulizer  solution 2.5 mg (2.5 mg Nebulization Given 01/09/20 0945)  sodium chloride 0.9 % bolus 1,000 mL (0 mLs Intravenous Stopped 01/09/20 1138)  magnesium sulfate IVPB 2 g 50 mL (0 g Intravenous Stopped 01/09/20 1138)     ED Discharge Orders         Ordered    albuterol (VENTOLIN HFA) 108 (90 Base) MCG/ACT inhaler  Every 6 hours PRN     01/09/20 1011    predniSONE (DELTASONE) 20 MG tablet  Daily     01/09/20 1011           Note:  This  document was prepared using Conservation officer, historic buildings and may include unintentional dictation errors.   Shaune Pollack, MD 01/09/20 [phone]

## 2020-02-03 ENCOUNTER — Emergency Department: Admission: EM | Admit: 2020-02-03 | Payer: Medicaid Other | Source: Home / Self Care

## 2020-05-27 ENCOUNTER — Ambulatory Visit: Payer: Medicaid Other

## 2020-10-06 ENCOUNTER — Emergency Department
Admission: EM | Admit: 2020-10-06 | Discharge: 2020-10-06 | Disposition: A | Discharge status: Home / Self Care | Payer: Medicaid Other | Attending: Emergency Medicine | Admitting: Emergency Medicine

## 2020-10-06 ENCOUNTER — Other Ambulatory Visit: Payer: Self-pay

## 2020-10-06 ENCOUNTER — Emergency Department: Payer: Medicaid Other

## 2020-10-06 ENCOUNTER — Encounter: Payer: Self-pay | Admitting: Emergency Medicine

## 2020-10-06 DIAGNOSIS — Z20822 Contact with and (suspected) exposure to covid-19: Secondary | ICD-10-CM | POA: Diagnosis not present

## 2020-10-06 DIAGNOSIS — J020 Streptococcal pharyngitis: Secondary | ICD-10-CM | POA: Insufficient documentation

## 2020-10-06 DIAGNOSIS — Z87891 Personal history of nicotine dependence: Secondary | ICD-10-CM | POA: Diagnosis not present

## 2020-10-06 DIAGNOSIS — J069 Acute upper respiratory infection, unspecified: Secondary | ICD-10-CM

## 2020-10-06 DIAGNOSIS — R070 Pain in throat: Secondary | ICD-10-CM | POA: Diagnosis present

## 2020-10-06 LAB — RESP PANEL BY RT-PCR (FLU A&B, COVID) ARPGX2
Influenza A by PCR: NEGATIVE
Influenza B by PCR: NEGATIVE
SARS Coronavirus 2 by RT PCR: NEGATIVE

## 2020-10-06 LAB — GROUP A STREP BY PCR: Group A Strep by PCR: DETECTED — AB

## 2020-10-06 MED ORDER — PSEUDOEPH-BROMPHEN-DM 30-2-10 MG/5ML PO SYRP: 5.0000 mL | ORAL_SOLUTION | Freq: Four times a day (QID) | ORAL | 0 refills | Status: DC | PRN

## 2020-10-06 MED ORDER — IBUPROFEN 600 MG PO TABS: 600.0000 mg | ORAL_TABLET | Freq: Three times a day (TID) | ORAL | 0 refills | Status: DC | PRN

## 2020-10-06 MED ORDER — AMOXICILLIN 875 MG PO TABS: 875.0000 mg | ORAL_TABLET | Freq: Two times a day (BID) | ORAL | 0 refills | Status: DC

## 2020-10-06 NOTE — ED Triage Notes (Signed)
C/O sorethroat, body aches, chills, cough x 2 days.

## 2020-10-06 NOTE — Discharge Instructions (Signed)
Your test was negative for COVID-19 and influenza.  Follow discharge care instruction take medication as directed. °

## 2020-10-06 NOTE — ED Provider Notes (Signed)
[facility] Emergency Department Provider Note   ____________________________________________   Event Date/Time   First MD Initiated Contact with Patient 10/06/20 [phone]     (approximate)  I have reviewed the triage vital signs and the nursing notes.   HISTORY  Chief Complaint Sore Throat    HPI Emily Peterson is a 29 y.o. female patient presents with sore throat, body aches, chills, and cough for 2 days.  Patient denies recent travel or known contact with COVID-19.  Patient states she has no intention of taking the COVID-19 or flu shots.      Past Medical History:  Diagnosis Date  . Gestational diabetes   . Medical history non-contributory     Patient Active Problem List   Diagnosis Date Noted  . Obesity (BMI 30-39.9) 04/15/2019  . History of abnormal cervical Pap smear 03/12/2019  . Smoker 03/12/2019  . Amniotic fluid leaking 02/23/2016    Past Surgical History:  Procedure Laterality Date  . NO PAST SURGERIES      Prior to Admission medications   Medication Sig Start Date End Date Taking? Authorizing Provider  albuterol (VENTOLIN HFA) 108 (90 Base) MCG/ACT inhaler Inhale 2 puffs into the lungs every 6 (six) hours as needed for wheezing or shortness of breath. 01/09/20   Shaune Pollack, MD  amoxicillin (AMOXIL) 875 MG tablet Take 1 tablet (875 mg total) by mouth 2 (two) times daily. 10/06/20   Joni Reining, PA-C  azithromycin (ZITHROMAX Z-PAK) 250 MG tablet Take 2 tablets (500 mg) on  Day 1,  followed by 1 tablet (250 mg) once daily on Days 2 through 5. 10/03/18   Tommi Rumps, PA-C  brompheniramine-pseudoephedrine-DM 30-2-10 MG/5ML syrup Take 5 mLs by mouth 4 (four) times daily as needed. 10/06/20   Joni Reining, PA-C  ibuprofen (ADVIL) 600 MG tablet Take 1 tablet (600 mg total) by mouth every 8 (eight) hours as needed. 10/06/20   Joni Reining, PA-C    Allergies Patient has no known allergies.  Family History  Problem  Relation Age of Onset  . Diabetes Father   . Diabetes Paternal Grandmother     Social History Social History   Tobacco Use  . Smoking status: Former Games developer  . Smokeless tobacco: Never Used  Substance Use Topics  . Alcohol use: No  . Drug use: No    Review of Systems Constitutional: Chills and body aches. Eyes: No visual changes. ENT: Sore throat. Cardiovascular: Denies chest pain. Respiratory: Denies shortness of breath.  Nonproductive cough. Gastrointestinal: No abdominal pain.  No nausea, no vomiting.  No diarrhea.  No constipation. Genitourinary: Negative for dysuria. Musculoskeletal: Negative for back pain. Skin: Negative for rash. Neurological: Negative for headaches, focal weakness or numbness.   ____________________________________________   PHYSICAL EXAM:  VITAL SIGNS: ED Triage Vitals  Enc Vitals Group     BP 10/06/20 0712 125/84     Pulse Rate 10/06/20 0712 91     Resp 10/06/20 0712 16     Temp 10/06/20 0712 98 F (36.7 C)     Temp Source 10/06/20 0712 Oral     SpO2 10/06/20 0712 96 %     Weight 10/06/20 0713 220 lb (99.8 kg)     Height 10/06/20 0713 5\' 2"  (1.575 m)     Head Circumference --      Peak Flow --      Pain Score 10/06/20 0721 9     Pain Loc --  Pain Edu? --      Excl. in GC? --     Constitutional: Alert and oriented. Well appearing and in no acute distress. Eyes: Conjunctivae are normal. PERRL. EOMI. Head: Atraumatic. Nose: No congestion/rhinnorhea. Mouth/Throat: Mucous membranes are moist.  Oropharynx erythematous. Neck: No stridor.  Hematological/Lymphatic/Immunilogical: No cervical lymphadenopathy. Cardiovascular: Normal rate, regular rhythm. Grossly normal heart sounds.  Good peripheral circulation. Respiratory: Normal respiratory effort.  No retractions. Lungs CTAB. Gastrointestinal: Soft and nontender. No distention. No abdominal bruits. No CVA tenderness. Musculoskeletal: No lower extremity tenderness nor edema.  No  joint effusions. Neurologic:  Normal speech and language. No gross focal neurologic deficits are appreciated. No gait instability. Skin:  Skin is warm, dry and intact. No rash noted. Psychiatric: Mood and affect are normal. Speech and behavior are normal.  ____________________________________________   LABS (all labs ordered are listed, but only abnormal results are displayed)  Labs Reviewed  GROUP A STREP BY PCR - Abnormal; Notable for the following components:      Result Value   Group A Strep by PCR DETECTED (*)    All other components within normal limits  RESP PANEL BY RT-PCR (FLU A&B, COVID) [id number]   ____________________________________________  EKG   ____________________________________________  RADIOLOGY I, Joni Reining, personally viewed and evaluated these images (plain radiographs) as part of my medical decision making, as well as reviewing the written report by the radiologist.  ED MD interpretation: No acute findings on chest x-ray. Official radiology report(s): DG Chest Portable 1 View  Result Date: 10/06/2020 CLINICAL DATA:  Cough and chills EXAM: PORTABLE CHEST 1 VIEW COMPARISON:  January 09, 2020 FINDINGS: Lungs are clear. Heart size and pulmonary vascularity are normal. No adenopathy. No bone lesions. IMPRESSION: Lungs clear.  Cardiac silhouette normal. Electronically Signed   By: Bretta Bang III M.D.   On: 10/06/2020 08:46    ____________________________________________   PROCEDURES  Procedure(s) performed (including Critical Care):  Procedures   ____________________________________________   INITIAL IMPRESSION / ASSESSMENT AND PLAN / ED COURSE  As part of my medical decision making, I reviewed the following data within the electronic MEDICAL RECORD NUMBER         Patient presents with 2 days of sore throat, body aches, chills, and cough. Patient test positive strep pharyngitis. Patient was negative for COVID-19 and influenza. Patient given  discharge care instruction advised take medication as directed. Patient advised follow-up PCP.      ____________________________________________   FINAL CLINICAL IMPRESSION(S) / ED DIAGNOSES  Final diagnoses:  Strep pharyngitis  Upper respiratory tract infection, unspecified type     ED Discharge Orders         Ordered    amoxicillin (AMOXIL) 875 MG tablet  2 times daily        10/06/20 0913    brompheniramine-pseudoephedrine-DM 30-2-10 MG/5ML syrup  4 times daily PRN        10/06/20 0913    ibuprofen (ADVIL) 600 MG tablet  Every 8 hours PRN        10/06/20 0913          *Please note:  Emily Peterson was evaluated in Emergency Department on 10/06/2020 for the symptoms described in the history of present illness. She was evaluated in the context of the global COVID-19 pandemic, which necessitated consideration that the patient might be at risk for infection with the SARS-CoV-2 virus that causes COVID-19. Institutional protocols and algorithms that pertain to the evaluation of patients at risk for COVID-19 are  in a state of rapid change based on information released by regulatory bodies including the CDC and federal and state organizations. These policies and algorithms were followed during the patient's care in the ED.  Some ED evaluations and interventions may be delayed as a result of limited staffing during and the pandemic.*   Note:  This document was prepared using Dragon voice recognition software and may include unintentional dictation errors.    Joni Reining, PA-C 10/06/20 0913    Sharman Cheek, MD 10/06/20 1225

## 2021-04-07 ENCOUNTER — Encounter: Payer: Self-pay | Admitting: Advanced Practice Midwife

## 2021-04-07 ENCOUNTER — Other Ambulatory Visit: Payer: Self-pay

## 2021-04-07 ENCOUNTER — Ambulatory Visit (LOCAL_COMMUNITY_HEALTH_CENTER): Payer: Medicaid Other | Admitting: Advanced Practice Midwife

## 2021-04-07 VITALS — BP 110/67 | HR 91 | Ht 62.0 in | Wt 226.0 lb

## 2021-04-07 DIAGNOSIS — Z3201 Encounter for pregnancy test, result positive: Secondary | ICD-10-CM | POA: Diagnosis not present

## 2021-04-07 DIAGNOSIS — A599 Trichomoniasis, unspecified: Secondary | ICD-10-CM

## 2021-04-07 DIAGNOSIS — Z3009 Encounter for other general counseling and advice on contraception: Secondary | ICD-10-CM | POA: Diagnosis not present

## 2021-04-07 DIAGNOSIS — F32A Depression, unspecified: Secondary | ICD-10-CM | POA: Insufficient documentation

## 2021-04-07 DIAGNOSIS — Z30013 Encounter for initial prescription of injectable contraceptive: Secondary | ICD-10-CM

## 2021-04-07 DIAGNOSIS — Z8742 Personal history of other diseases of the female genital tract: Secondary | ICD-10-CM

## 2021-04-07 DIAGNOSIS — O99340 Other mental disorders complicating pregnancy, unspecified trimester: Secondary | ICD-10-CM

## 2021-04-07 DIAGNOSIS — Z8632 Personal history of gestational diabetes: Secondary | ICD-10-CM

## 2021-04-07 DIAGNOSIS — F909 Attention-deficit hyperactivity disorder, unspecified type: Secondary | ICD-10-CM | POA: Insufficient documentation

## 2021-04-07 LAB — WET PREP FOR TRICH, YEAST, CLUE
Trichomonas Exam: POSITIVE — AB
Yeast Exam: NEGATIVE

## 2021-04-07 LAB — PREGNANCY, URINE: Preg Test, Ur: POSITIVE — AB

## 2021-04-07 MED ORDER — PRENATAL VITAMIN 27-0.8 MG PO TABS: 1.0000 | ORAL_TABLET | Freq: Every day | ORAL | 0 refills | Status: DC

## 2021-04-07 MED ORDER — METRONIDAZOLE 500 MG PO TABS: 500.0000 mg | ORAL_TABLET | Freq: Two times a day (BID) | ORAL | 0 refills | Status: DC

## 2021-04-07 NOTE — Progress Notes (Signed)
Wet mount with positive clue, positive amine and trich. Per consult with E. Sciora CNM, treat trich per standing order (done). UPT positive, PNV dispensed. Per client, does not plan to continue with pregnancy as youngest child is 52 months old. PT packet provided. Jossie Ng, RN

## 2021-04-07 NOTE — Progress Notes (Signed)
[facility] [address] Main Number: [phone]    Family Planning Visit- Initial Visit  Subjective:  Emily Peterson is a 30 y.o. SBF [id number] (10, 6,5,1) smoker  being seen today for an initial annual visit and to discuss contraceptive options.  The patient is currently using None for pregnancy prevention. Patient reports she does not want a pregnancy in the next year.  Patient has the following medical conditions has History of abnormal cervical Pap smear; Obesity (BMI 41.3); Smoker; Personal history of gestational diabetes; and ADHD on their problem list.  Chief Complaint  Patient presents with   Annual Exam    Desires Depo    Patient reports wants physical and DMPA today. LMP spotting last week. Last sex 03/18/21 without condom; with current partner x 10 years; 1 partner in last 3 mo. Smoking 1/2-1.5 ppd. Last MJ 10 years ago. Last ETOH 03/26/21 (>5 liquor drinks) 1x/wk. Working 40 hrs/wk and living with her 4 kids. Breastfeeding 1 yo at night.   Patient denies vaping, cigars  Body mass index is 41.34 kg/m. - Patient is eligible for diabetes screening based on BMI and age >31?  not applicable HA1C ordered? no  Patient reports 1  partner/s in last year. Desires STI screening?  Yes  Has patient been screened once for HCV in the past?  No  No results found for: HCVAB  Does the patient have current drug use (including MJ), have a partner with drug use, and/or has been incarcerated since last result? No  If yes-- Screen for HCV through [facility]   Does the patient meet criteria for HBV testing? No  Criteria:  -Household, sexual or needle sharing contact with HBV -History of drug use -HIV positive -Those with known Hep C   Health Maintenance Due  Topic Date Due   COVID-19 Vaccine (1) Never done   Pneumococcal Vaccine 27-15 Years old (1 - PCV) Never done   Hepatitis C Screening  Never done   PAP SMEAR-Modifier   10/11/2019    Review of Systems  All other systems reviewed and are negative.  The following portions of the patient's history were reviewed and updated as appropriate: allergies, current medications, past family history, past medical history, past social history, past surgical history and problem list. Problem list updated.   See flowsheet for other program required questions.  Objective:   Vitals:   04/07/21 1524  BP: 110/67  Pulse: 91  Weight: 226 lb (102.5 kg)  Height: 5\' 2"  (1.575 m)    Physical Exam Constitutional:      Appearance: Normal appearance. She is obese.  HENT:     Head: Normocephalic and atraumatic.     Mouth/Throat:     Mouth: Mucous membranes are moist.     Comments: Last dental exam 12/2019; urged to make dental apt asap Eyes:     Conjunctiva/sclera: Conjunctivae normal.  Neck:     Thyroid: No thyroid mass, thyromegaly or thyroid tenderness.  Cardiovascular:     Rate and Rhythm: Normal rate and regular rhythm.  Pulmonary:     Effort: Pulmonary effort is normal.     Breath sounds: Normal breath sounds.  Chest:  Breasts:    Right: Normal.     Left: Normal.     Comments: Breastfeeding 30 yo at night only Abdominal:     Palpations: Abdomen is soft.     Comments: Soft without masses or tenderness, poor tone, increased adipose  Genitourinary:    General: Normal vulva.     Exam position: Lithotomy position.     Vagina: Vaginal discharge (pink-brown small amt leukorrhea, ph>4.5 with sl malodor) present.     Cervix: Normal.     Uterus: Normal.      Adnexa: Right adnexa normal and left adnexa normal.     Rectum: Normal.  Musculoskeletal:        General: Normal range of motion.     Cervical back: Normal range of motion and neck supple.  Skin:    General: Skin is warm and dry.  Neurological:     Mental Status: She is alert.  Psychiatric:        Mood and Affect: Mood normal.      Assessment and Plan:  Emily Peterson is a 30 y.o. female  presenting to the [facility] Department for an initial annual wellness/contraceptive visit  Contraception counseling: Reviewed all forms of birth control options in the tiered based approach. available including abstinence; over the counter/barrier methods; hormonal contraceptive medication including pill, patch, ring, injection,contraceptive implant, ECP; hormonal and nonhormonal IUDs; permanent sterilization options including vasectomy and the various tubal sterilization modalities. Risks, benefits, and typical effectiveness rates were reviewed.  Questions were answered.  Written information was also given to the patient to review.  Patient desires DMPA, this was prescribed for patient. She will follow up in  11-13 wks for surveillance.  She was told to call with any further questions, or with any concerns about this method of contraception.  Emphasized use of condoms 100% of the time for STI prevention.  Patient was offered ECP. ECP was not accepted by the patient. ECP counseling was not given - see RN documentation  1. Family planning Treat wet mount per standing orders Immunization nurse consult Declines counseling as is seeing Camillia Herter at [facility] for depression onset 3 wks ago - WET PREP FOR TRICH, YEAST, CLUE - Pregnancy, urine - Chlamydia/Gonorrhea [facility] - [facility] - Syphilis Serology, [facility]  2. Encounter for initial prescription of injectable contraceptive If PT neg today may have DMPA 150 mg IM q 11-13 wks x 1 year Please counsel on need for abstinance/back up condoms next 7 days  3. History of abnormal cervical Pap smear In past 04/15/19 neg pap  4. Personal history of gestational diabetes   5. Attention deficit hyperactivity disorder (ADHD), unspecified ADHD type Not on meds     No follow-ups on file.  No future appointments.  Alberteen Spindle, CNM

## 2021-04-11 ENCOUNTER — Telehealth: Payer: Self-pay | Admitting: Family Medicine

## 2021-04-11 NOTE — Telephone Encounter (Signed)
Patient called to schedule an appointment for an ultrasound to terminate her pregnancy at another facility.

## 2021-04-11 NOTE — Telephone Encounter (Signed)
Client desires EAB, but [place] at termination clinic in [place] charges $150 for [place] and client unable to use her Medicaid or insurance to cover cost of [place]. Client requested [facility] order her an [place] and counseled hat agency unable to do so. Jossie Ng, RN

## 2021-06-29 IMAGING — DX DG CHEST 1V PORT
1 series · 1 of 1 positions shown · non-contrast
Comparison: 07/30/2019

CLINICAL DATA: Shortness of breath, wheezing, chest pain

EXAM:
PORTABLE CHEST 1 VIEW

[chest ap]
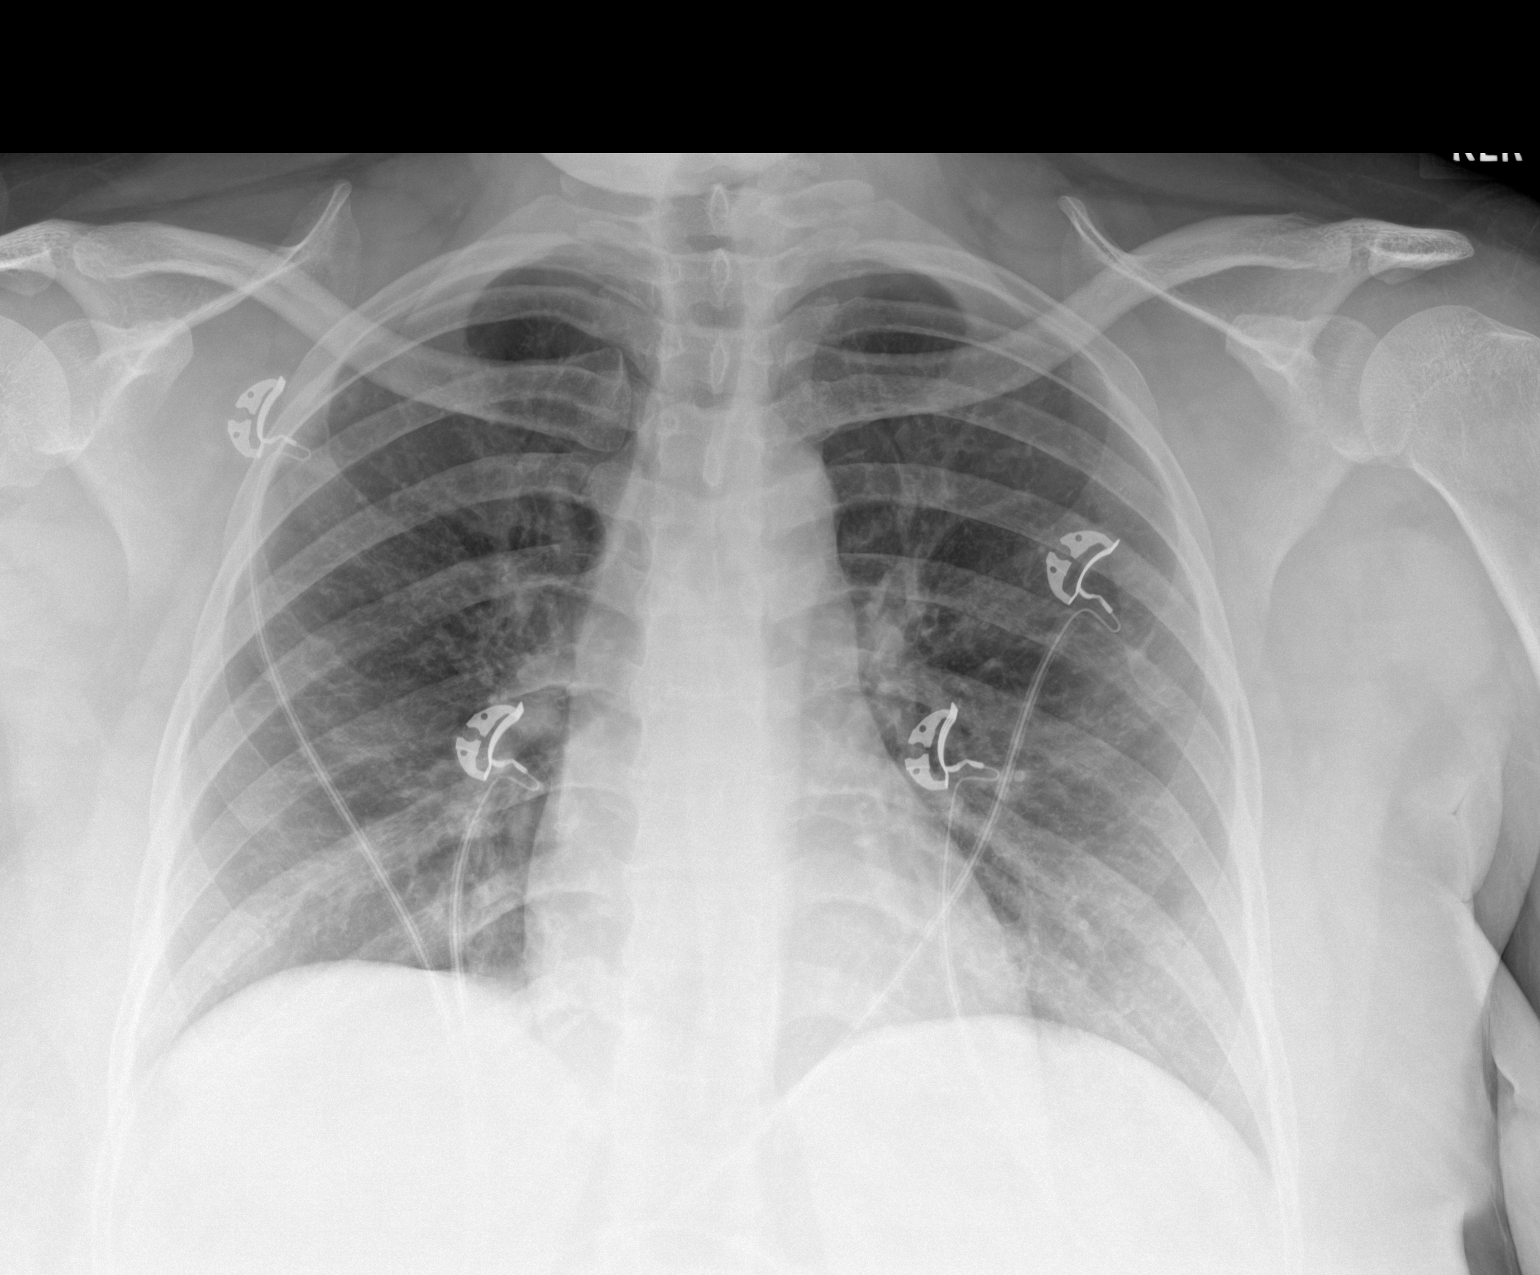

[1 of 1 positions shown; findings below may reference images not displayed]

FINDINGS: The heart size and mediastinal contours are within normal limits.
Both lungs are clear. The visualized skeletal structures are
unremarkable.
IMPRESSION: No acute abnormality of the lungs in AP portable projection.

## 2021-07-05 ENCOUNTER — Other Ambulatory Visit: Payer: Self-pay

## 2021-07-05 ENCOUNTER — Ambulatory Visit (LOCAL_COMMUNITY_HEALTH_CENTER): Payer: Medicaid Other | Admitting: Physician Assistant

## 2021-07-05 VITALS — BP 115/78 | Ht 62.0 in | Wt 228.2 lb

## 2021-07-05 DIAGNOSIS — Z30012 Encounter for prescription of emergency contraception: Secondary | ICD-10-CM

## 2021-07-05 DIAGNOSIS — Z3202 Encounter for pregnancy test, result negative: Secondary | ICD-10-CM

## 2021-07-05 DIAGNOSIS — Z30013 Encounter for initial prescription of injectable contraceptive: Secondary | ICD-10-CM | POA: Diagnosis not present

## 2021-07-05 DIAGNOSIS — Z3009 Encounter for other general counseling and advice on contraception: Secondary | ICD-10-CM | POA: Diagnosis not present

## 2021-07-05 DIAGNOSIS — A5901 Trichomonal vulvovaginitis: Secondary | ICD-10-CM

## 2021-07-05 LAB — PREGNANCY, URINE: Preg Test, Ur: NEGATIVE

## 2021-07-05 MED ORDER — MEDROXYPROGESTERONE ACETATE 150 MG/ML IM SUSP
150.0000 mg | INTRAMUSCULAR | Status: AC
  Administered 2021-07-05: 150 mg via INTRAMUSCULAR

## 2021-07-05 MED ORDER — METRONIDAZOLE 500 MG PO TABS: 500.0000 mg | ORAL_TABLET | Freq: Two times a day (BID) | ORAL | 0 refills | Status: AC

## 2021-07-05 NOTE — Progress Notes (Signed)
Negative pregnancy test. Depo was given in the L deltoid. Condoms and contact card was given. Appt reminder card given to patient. Next depo due on 09/20/21. No further questions or concerns.

## 2021-07-07 NOTE — Progress Notes (Signed)
[facility]  Subjective:  Emily Peterson is a 30 y.o. being seen today for Depo.  Chief Complaint  Patient presents with   Contraception    Depo and STD check    HPI Patient reports that she had her PE in June and a medical abortion in July, but did not follow up after being given the pills to take.  Reports that she also has not done any OTC pregnancy test at home since she stopped bleeding after the procedure.  States that she first had sex about 2 weeks ago and took a Plan B that she was given at the clinic where she had her procedure.  States that she still has a Plan B that the also gave her.  Patient states that she would like to get a Depo today.  Does the patient have a current or past history of drug use? No   No components found for: HCV]   Health Maintenance Due  Topic Date Due   COVID-19 Vaccine (1) Never done   Pneumococcal Vaccine 71-69 Years old (1 - PCV) Never done   Hepatitis C Screening  Never done   PAP SMEAR-Modifier  10/11/2019   INFLUENZA VACCINE  Never done    Review of Systems  All other systems reviewed and are negative.  The following portions of the patient's history were reviewed and updated as appropriate: allergies, current medications, past family history, past medical history, past social history, past surgical history and problem list. Problem list updated.   See flowsheet for other program required questions.  Objective:   Vitals:   07/05/21 1100  BP: 115/78  Weight: 228 lb 3.2 oz (103.5 kg)  Height: 5\' 2"  (1.575 m)    Physical Exam Vitals and nursing note reviewed.  Constitutional:      General: She is not in acute distress.    Appearance: Normal appearance.  HENT:     Head: Normocephalic and atraumatic.  Eyes:     Conjunctiva/sclera: Conjunctivae normal.  Pulmonary:     Effort: Pulmonary effort is normal.  Skin:    General: Skin is warm and dry.   Neurological:     Mental Status: She is alert and oriented to person, place, and time.  Psychiatric:        Mood and Affect: Mood normal.        Behavior: Behavior normal.        Thought Content: Thought content normal.        Judgment: Judgment normal.      Assessment and Plan:  Emily Peterson is a 30 y.o. female presenting to the [facility] Department for a Women's Health problem visit  1. Encounter for counseling regarding contraception Reviewed with patient re: BCM options. Reviewed with patient normal SE of Depo and when to call clinic with concerns. Enc condoms with all sex for STD protection.  Pregnancy test negative today.  - Pregnancy, urine  2. Trichomonal vulvovaginitis Will re-treat for Trich today since patient reports that she did not complete the medicine for Trich that she was given at her last visit.  States that she lost her medicine. Enc to complete Metronidazole 500 mg #14 1 po BID for 7 days with food, no EtOH for 24 hr before and until 72 hr after completing medicine. Counseled patient to not have sex for 14 days and until after partner completes treatment. Enc to use OTC antifungal cream if  has itching after completing antibiotics. - metroNIDAZOLE (FLAGYL) 500 MG tablet; Take 1 tablet (500 mg total) by mouth 2 (two) times daily for 7 days.  Dispense: 14 tablet; Refill: 0  3. Initiation of Depo Provera OK for Depo 150 mg IM q 11-13 weeks for 1 year. - medroxyPROGESTERone (DEPO-PROVERA) injection 150 mg  4. Encounter for morning after pill Patient to take Plan B that she has at home ASAP today. Patient to take OTC pregnancy test at home in 2 weeks and RTC if that test is positive.     Return in about 11 weeks (around 09/20/2021) for depo.  No future appointments.  Matt Holmes, PA

## 2021-07-28 ENCOUNTER — Other Ambulatory Visit: Payer: Self-pay

## 2021-07-28 ENCOUNTER — Inpatient Hospital Stay
Admission: EM | Admit: 2021-07-28 | Discharge: 2021-08-01 | DRG: 202 | Disposition: A | Discharge status: Home / Self Care | Payer: Commercial Managed Care - PPO | Attending: Internal Medicine | Admitting: Internal Medicine

## 2021-07-28 ENCOUNTER — Emergency Department: Payer: Commercial Managed Care - PPO

## 2021-07-28 DIAGNOSIS — R079 Chest pain, unspecified: Secondary | ICD-10-CM | POA: Diagnosis not present

## 2021-07-28 DIAGNOSIS — Z20822 Contact with and (suspected) exposure to covid-19: Secondary | ICD-10-CM | POA: Diagnosis present

## 2021-07-28 DIAGNOSIS — J440 Chronic obstructive pulmonary disease with acute lower respiratory infection: Secondary | ICD-10-CM | POA: Diagnosis present

## 2021-07-28 DIAGNOSIS — Z8632 Personal history of gestational diabetes: Secondary | ICD-10-CM | POA: Diagnosis not present

## 2021-07-28 DIAGNOSIS — J441 Chronic obstructive pulmonary disease with (acute) exacerbation: Secondary | ICD-10-CM | POA: Diagnosis present

## 2021-07-28 DIAGNOSIS — Z6841 Body Mass Index (BMI) 40.0 and over, adult: Secondary | ICD-10-CM

## 2021-07-28 DIAGNOSIS — E669 Obesity, unspecified: Secondary | ICD-10-CM | POA: Diagnosis present

## 2021-07-28 DIAGNOSIS — R0602 Shortness of breath: Secondary | ICD-10-CM | POA: Diagnosis not present

## 2021-07-28 DIAGNOSIS — Z833 Family history of diabetes mellitus: Secondary | ICD-10-CM

## 2021-07-28 DIAGNOSIS — J209 Acute bronchitis, unspecified: Secondary | ICD-10-CM | POA: Diagnosis present

## 2021-07-28 DIAGNOSIS — R000 Tachycardia, unspecified: Secondary | ICD-10-CM | POA: Diagnosis present

## 2021-07-28 DIAGNOSIS — Z87440 Personal history of urinary (tract) infections: Secondary | ICD-10-CM

## 2021-07-28 DIAGNOSIS — Z8249 Family history of ischemic heart disease and other diseases of the circulatory system: Secondary | ICD-10-CM | POA: Diagnosis not present

## 2021-07-28 DIAGNOSIS — F17210 Nicotine dependence, cigarettes, uncomplicated: Secondary | ICD-10-CM | POA: Diagnosis present

## 2021-07-28 LAB — CBC WITH DIFFERENTIAL/PLATELET
Abs Immature Granulocytes: 0.03 10*3/uL (ref 0.00–0.07)
Basophils Absolute: 0.1 10*3/uL (ref 0.0–0.1)
Basophils Relative: 1 %
Eosinophils Absolute: 0.3 10*3/uL (ref 0.0–0.5)
Eosinophils Relative: 2 %
HCT: 44.2 % (ref 36.0–46.0)
Hemoglobin: 15 g/dL (ref 12.0–15.0)
Immature Granulocytes: 0 %
Lymphocytes Relative: 13 %
Lymphs Abs: 1.5 10*3/uL (ref 0.7–4.0)
MCH: 29.4 pg (ref 26.0–34.0)
MCHC: 33.9 g/dL (ref 30.0–36.0)
MCV: 86.7 fL (ref 80.0–100.0)
Monocytes Absolute: 0.7 10*3/uL (ref 0.1–1.0)
Monocytes Relative: 6 %
Neutro Abs: 9 10*3/uL — ABNORMAL HIGH (ref 1.7–7.7)
Neutrophils Relative %: 78 %
Platelets: 244 10*3/uL (ref 150–400)
RBC: 5.1 MIL/uL (ref 3.87–5.11)
RDW: 12.9 % (ref 11.5–15.5)
WBC: 11.6 10*3/uL — ABNORMAL HIGH (ref 4.0–10.5)
nRBC: 0 % (ref 0.0–0.2)

## 2021-07-28 LAB — URINALYSIS, COMPLETE (UACMP) WITH MICROSCOPIC
Bacteria, UA: NONE SEEN
Bilirubin Urine: NEGATIVE
Glucose, UA: NEGATIVE mg/dL
Ketones, ur: NEGATIVE mg/dL
Leukocytes,Ua: NEGATIVE
Nitrite: NEGATIVE
Protein, ur: 100 mg/dL — AB
Specific Gravity, Urine: 1.024 (ref 1.005–1.030)
pH: 5 (ref 5.0–8.0)

## 2021-07-28 LAB — COMPREHENSIVE METABOLIC PANEL
ALT: 38 U/L (ref 0–44)
AST: 18 U/L (ref 15–41)
Albumin: 4.2 g/dL (ref 3.5–5.0)
Alkaline Phosphatase: 62 U/L (ref 38–126)
Anion gap: 11 (ref 5–15)
BUN: 8 mg/dL (ref 6–20)
CO2: 22 mmol/L (ref 22–32)
Calcium: 9.6 mg/dL (ref 8.9–10.3)
Chloride: 102 mmol/L (ref 98–111)
Creatinine, Ser: 0.7 mg/dL (ref 0.44–1.00)
GFR, Estimated: >60 mL/min (ref >60)
Glucose, Bld: 110 mg/dL — ABNORMAL HIGH (ref 70–99)
Potassium: 4.1 mmol/L (ref 3.5–5.1)
Sodium: 135 mmol/L (ref 135–145)
Total Bilirubin: 0.8 mg/dL (ref 0.3–1.2)
Total Protein: 7.9 g/dL (ref 6.5–8.1)

## 2021-07-28 LAB — RESP PANEL BY RT-PCR (FLU A&B, COVID) ARPGX2
Influenza A by PCR: NEGATIVE
Influenza B by PCR: NEGATIVE
SARS Coronavirus 2 by RT PCR: NEGATIVE

## 2021-07-28 LAB — LACTIC ACID, PLASMA: Lactic Acid, Venous: 0.9 mmol/L (ref 0.5–1.9)

## 2021-07-28 LAB — D-DIMER, QUANTITATIVE: D-Dimer, Quant: 0.47 ug/mL-FEU (ref 0.00–0.50)

## 2021-07-28 LAB — TROPONIN I (HIGH SENSITIVITY)
Troponin I (High Sensitivity): <2 ng/L (ref <18)
Troponin I (High Sensitivity): 3 ng/L (ref <18)

## 2021-07-28 MED ORDER — GUAIFENESIN ER 600 MG PO TB12
600.0000 mg | ORAL_TABLET | Freq: Two times a day (BID) | ORAL | Status: DC
  Administered 2021-07-28 – 2021-08-01 (×8): 600 mg via ORAL
  Filled 2021-07-28 (×8): qty 1

## 2021-07-28 MED ORDER — ONDANSETRON HCL 4 MG/2ML IJ SOLN: 4.0000 mg | Freq: Four times a day (QID) | INTRAMUSCULAR | Status: DC | PRN

## 2021-07-28 MED ORDER — HYDROCOD POLST-CPM POLST ER 10-8 MG/5ML PO SUER: 5.0000 mL | Freq: Two times a day (BID) | ORAL | Status: DC | PRN

## 2021-07-28 MED ORDER — ACETAMINOPHEN 650 MG RE SUPP
650.0000 mg | Freq: Four times a day (QID) | RECTAL | Status: DC | PRN
  Filled 2021-07-28: qty 1

## 2021-07-28 MED ORDER — TRAZODONE HCL 50 MG PO TABS: 25.0000 mg | ORAL_TABLET | Freq: Every evening | ORAL | Status: DC | PRN

## 2021-07-28 MED ORDER — MORPHINE SULFATE (PF) 2 MG/ML IV SOLN: 2.0000 mg | INTRAVENOUS | Status: DC | PRN

## 2021-07-28 MED ORDER — ALBUTEROL SULFATE (2.5 MG/3ML) 0.083% IN NEBU
2.5000 mg | INHALATION_SOLUTION | Freq: Once | RESPIRATORY_TRACT | Status: AC
  Administered 2021-07-28: 2.5 mg via RESPIRATORY_TRACT
  Filled 2021-07-28: qty 3

## 2021-07-28 MED ORDER — ALBUTEROL SULFATE HFA 108 (90 BASE) MCG/ACT IN AERS
2.0000 | INHALATION_SPRAY | RESPIRATORY_TRACT | Status: DC | PRN
  Filled 2021-07-28: qty 6.7

## 2021-07-28 MED ORDER — SODIUM CHLORIDE 0.9 % IV SOLN: INTRAVENOUS | Status: DC

## 2021-07-28 MED ORDER — DILTIAZEM HCL 25 MG/5ML IV SOLN
2.5000 mg | Freq: Once | INTRAVENOUS | Status: AC
  Administered 2021-07-28: 2.5 mg via INTRAVENOUS
  Filled 2021-07-28: qty 5

## 2021-07-28 MED ORDER — IPRATROPIUM-ALBUTEROL 0.5-2.5 (3) MG/3ML IN SOLN
9.0000 mL | Freq: Once | RESPIRATORY_TRACT | Status: AC
  Administered 2021-07-28: 9 mL via RESPIRATORY_TRACT
  Filled 2021-07-28: qty 9

## 2021-07-28 MED ORDER — ACETAMINOPHEN 325 MG PO TABS: 650.0000 mg | ORAL_TABLET | Freq: Four times a day (QID) | ORAL | Status: DC | PRN

## 2021-07-28 MED ORDER — NITROGLYCERIN 0.4 MG SL SUBL: 0.4000 mg | SUBLINGUAL_TABLET | SUBLINGUAL | Status: DC | PRN

## 2021-07-28 MED ORDER — IPRATROPIUM-ALBUTEROL 0.5-2.5 (3) MG/3ML IN SOLN
3.0000 mL | Freq: Four times a day (QID) | RESPIRATORY_TRACT | Status: DC
  Administered 2021-07-28 – 2021-07-31 (×8): 3 mL via RESPIRATORY_TRACT
  Filled 2021-07-28 (×8): qty 3

## 2021-07-28 MED ORDER — MAGNESIUM SULFATE 2 GM/50ML IV SOLN
2.0000 g | Freq: Once | INTRAVENOUS | Status: AC
  Administered 2021-07-28: 2 g via INTRAVENOUS
  Filled 2021-07-28: qty 50

## 2021-07-28 MED ORDER — PREDNISONE 20 MG PO TABS
40.0000 mg | ORAL_TABLET | Freq: Every day | ORAL | Status: DC
  Administered 2021-07-30: 40 mg via ORAL
  Filled 2021-07-28: qty 2

## 2021-07-28 MED ORDER — ENOXAPARIN SODIUM 60 MG/0.6ML IJ SOSY
0.5000 mg/kg | PREFILLED_SYRINGE | INTRAMUSCULAR | Status: DC
  Administered 2021-07-28 – 2021-07-31 (×4): 50 mg via SUBCUTANEOUS
  Filled 2021-07-28: qty 0.6
  Filled 2021-07-28 (×4): qty 0.5

## 2021-07-28 MED ORDER — MAGNESIUM HYDROXIDE 400 MG/5ML PO SUSP: 30.0000 mL | Freq: Every day | ORAL | Status: DC | PRN

## 2021-07-28 MED ORDER — METHYLPREDNISOLONE SODIUM SUCC 40 MG IJ SOLR
40.0000 mg | Freq: Three times a day (TID) | INTRAMUSCULAR | Status: AC
Start: 2021-07-28 — End: 2021-07-29
  Administered 2021-07-28 – 2021-07-29 (×3): 40 mg via INTRAVENOUS
  Filled 2021-07-28 (×3): qty 1

## 2021-07-28 MED ORDER — ONDANSETRON HCL 4 MG PO TABS: 4.0000 mg | ORAL_TABLET | Freq: Four times a day (QID) | ORAL | Status: DC | PRN

## 2021-07-28 MED ORDER — ASPIRIN EC 325 MG PO TBEC
325.0000 mg | DELAYED_RELEASE_TABLET | Freq: Every day | ORAL | Status: DC
Start: 2021-07-28 — End: 2021-08-01
  Administered 2021-07-28 – 2021-08-01 (×5): 325 mg via ORAL
  Filled 2021-07-28 (×5): qty 1

## 2021-07-28 MED ORDER — SODIUM CHLORIDE 0.9 % IV SOLN
1.0000 g | INTRAVENOUS | Status: DC
  Administered 2021-07-28 – 2021-07-30 (×3): 1 g via INTRAVENOUS
  Filled 2021-07-28: qty 1
  Filled 2021-07-28 (×3): qty 10

## 2021-07-28 MED ORDER — PREDNISONE 20 MG PO TABS
60.0000 mg | ORAL_TABLET | Freq: Once | ORAL | Status: AC
  Administered 2021-07-28: 60 mg via ORAL
  Filled 2021-07-28: qty 3

## 2021-07-28 NOTE — ED Provider Notes (Addendum)
[facility] Emergency Department Provider Note   ____________________________________________   Event Date/Time   First MD Initiated Contact with Patient 07/28/21 1838     (approximate)  I have reviewed the triage vital signs and the nursing notes.   HISTORY  Chief Complaint Shortness of Breath    HPI Emily Peterson is a 30 y.o. female with past medical history of bronchitis who presents to the ED complaining of chest pain and shortness of breath.  Patient reports that over the past 24 hours she has been dealing with a worsening dry cough, difficulty catching her breath, and heaviness in her chest.  She states it feels like there is a "ton of bricks" is sitting on her chest.  She has previously been diagnosed with bronchitis, but denies any history of COPD or asthma.  She was previously prescribed an inhaler and has been using it this at home without significant relief.  She has not noticed any fevers, denies nausea, vomiting, abdominal pain, pain or swelling in her legs.  In her prior episodes of bronchitis, she has never had breathing difficulties this severe.        Past Medical History:  Diagnosis Date   Gestational diabetes    Did not go for 6 week post-partum appt   History of abnormal cervical Pap smear    Age 88 years, subsequent PAPs normal   History of anemia    History of spontaneous abortion 2010   History of urinary tract infection    Medical history non-contributory     Patient Active Problem List   Diagnosis Date Noted   COPD exacerbation (HCC) 07/28/2021   Personal history of gestational diabetes 04/07/2021   ADHD 04/07/2021   Depression dx'd 7 years ago during pregnancy 04/07/2021   Obesity (BMI 41.3) 04/15/2019   History of abnormal cervical Pap smear 03/12/2019   Smoker 1/2-1.5 ppd 03/12/2019    Past Surgical History:  Procedure Laterality Date   NO PAST SURGERIES     No surgical history      Prior to Admission  medications   Medication Sig Start Date End Date Taking? Authorizing Provider  albuterol (VENTOLIN HFA) 108 (90 Base) MCG/ACT inhaler Inhale 2 puffs into the lungs every 6 (six) hours as needed for wheezing or shortness of breath. Patient not taking: No sig reported 01/09/20   Shaune Pollack, MD  amoxicillin (AMOXIL) 875 MG tablet Take 1 tablet (875 mg total) by mouth 2 (two) times daily. Patient not taking: No sig reported 10/06/20   Joni Reining, PA-C  azithromycin (ZITHROMAX Z-PAK) 250 MG tablet Take 2 tablets (500 mg) on  Day 1,  followed by 1 tablet (250 mg) once daily on Days 2 through 5. Patient not taking: No sig reported 10/03/18   Bridget Hartshorn L, PA-C  brompheniramine-pseudoephedrine-DM 30-2-10 MG/5ML syrup Take 5 mLs by mouth 4 (four) times daily as needed. Patient not taking: No sig reported 10/06/20   Joni Reining, PA-C  ibuprofen (ADVIL) 600 MG tablet Take 1 tablet (600 mg total) by mouth every 8 (eight) hours as needed. Patient not taking: No sig reported 10/06/20   Joni Reining, PA-C  metroNIDAZOLE (FLAGYL) 500 MG tablet Take 1 tablet (500 mg total) by mouth 2 (two) times daily. Patient not taking: Reported on 07/05/2021 04/07/21   Federico Flake, MD  Prenatal Vit-Fe Fumarate-FA (PRENATAL VITAMIN) 27-0.8 MG TABS Take 1 tablet by mouth daily at 6 (six) AM. Patient not taking: Reported on  07/05/2021 04/07/21   Federico Flake, MD    Allergies Patient has no known allergies.  Family History  Problem Relation Age of Onset   Cancer Paternal Grandmother    Diabetes Paternal Grandmother    Healthy Maternal Grandmother    Hypertension Maternal Grandfather    Diabetes Father    Healthy Mother    Healthy Son    Healthy Son    Healthy Son    Healthy Son     Social History Social History   Tobacco Use   Smoking status: Every Day    Packs/day: 1.50    Years: 13.00    Pack years: 19.50    Types: Cigarettes   Smokeless tobacco: Never  Vaping Use    Vaping Use: Never used  Substance Use Topics   Alcohol use: Yes    Alcohol/week: 5.0 standard drinks    Types: 5 Shots of liquor per week    Comment: Last ETOH use 03/26/2021.   Drug use: Not Currently    Types: Marijuana    Comment: Last marijuana use 10 years.    Review of Systems  Constitutional: No fever/chills Eyes: No visual changes. ENT: No sore throat. Cardiovascular: Positive for chest pain. Respiratory: Positive for cough and shortness of breath. Gastrointestinal: No abdominal pain.  No nausea, no vomiting.  No diarrhea.  No constipation. Genitourinary: Negative for dysuria. Musculoskeletal: Negative for back pain. Skin: Negative for rash. Neurological: Negative for headaches, focal weakness or numbness.  ____________________________________________   PHYSICAL EXAM:  VITAL SIGNS: ED Triage Vitals  Enc Vitals Group     BP 07/28/21 1553 (!) 148/100     Pulse Rate 07/28/21 1553 (!) 120     Resp 07/28/21 1553 (!) 24     Temp 07/28/21 1553 99.5 F (37.5 C)     Temp Source 07/28/21 1553 Oral     SpO2 07/28/21 1553 97 %     Weight 07/28/21 1553 225 lb (102.1 kg)     Height 07/28/21 1553 5\' 2"  (1.575 m)     Head Circumference --      Peak Flow --      Pain Score 07/28/21 1603 9     Pain Loc --      Pain Edu? --      Excl. in GC? --     Constitutional: Alert and oriented. Eyes: Conjunctivae are normal. Head: Atraumatic. Nose: No congestion/rhinnorhea. Mouth/Throat: Mucous membranes are moist. Neck: Normal ROM Cardiovascular: Tachycardic, regular rhythm. Grossly normal heart sounds.  2+ radial pulses bilaterally. Respiratory: Tachypneic with increased respiratory effort.  Inspiratory and expiratory wheezes noted bilaterally. Gastrointestinal: Soft and nontender. No distention. Genitourinary: deferred Musculoskeletal: No lower extremity tenderness nor edema. Neurologic:  Normal speech and language. No gross focal neurologic deficits are appreciated. Skin:   Skin is warm, dry and intact. No rash noted. Psychiatric: Mood and affect are normal. Speech and behavior are normal.  ____________________________________________   LABS (all labs ordered are listed, but only abnormal results are displayed)  Labs Reviewed  COMPREHENSIVE METABOLIC PANEL - Abnormal; Notable for the following components:      Result Value   Glucose, Bld 110 (*)    All other components within normal limits  CBC WITH DIFFERENTIAL/PLATELET - Abnormal; Notable for the following components:   WBC 11.6 (*)    Neutro Abs 9.0 (*)    All other components within normal limits  URINALYSIS, COMPLETE (UACMP) WITH MICROSCOPIC - Abnormal; Notable for the following components:   Color,  Urine YELLOW (*)    APPearance CLEAR (*)    Hgb urine dipstick LARGE (*)    Protein, ur 100 (*)    All other components within normal limits  RESP PANEL BY RT-PCR (FLU A&B, COVID) ARPGX2  EXPECTORATED SPUTUM ASSESSMENT W GRAM STAIN, RFLX TO RESP C  LACTIC ACID, PLASMA  D-DIMER, QUANTITATIVE  HIV ANTIBODY (ROUTINE TESTING W REFLEX)  BASIC METABOLIC PANEL  CBC  POC URINE PREG, ED  TROPONIN I (HIGH SENSITIVITY)   ____________________________________________  EKG  ED ECG REPORT I, Chesley Noon, the attending physician, personally viewed and interpreted this ECG.   Date: 07/28/2021  EKG Time: 16:06  Rate: 117  Rhythm: sinus tachycardia  Axis: Normal  Intervals:none  ST&T Change: Inferolateral T wave inversions  ED ECG REPORT I, Chesley Noon, the attending physician, personally viewed and interpreted this ECG.   Date: 07/28/2021  EKG Time: 19:23  Rate: 148  Rhythm: sinus tachycardia  Axis: Normal  Intervals:none  ST&T Change: Inferolateral T wave inversions   PROCEDURES  Procedure(s) performed (including Critical Care):  Procedures   ____________________________________________   INITIAL IMPRESSION / ASSESSMENT AND PLAN / ED COURSE      30 year old female with past  medical history of bronchitis presents to the ED with increasing cough, chest pressure, and shortness of breath since yesterday.  Patient noted to be tachycardic and tachypneic with significant wheezing on exam concerning for asthma versus COPD.  She is a smoker but does not have a prior documented history of COPD or asthma, we will broaden work-up to assess for PE and send D-dimer.  EKG shows inferolateral T wave inversions and we will check troponin but overall low suspicion for ACS.  We will treat with duo nebs and steroids, reassess.  Chest x-ray reviewed by me and shows no infiltrate, edema, or effusion.  Patient with improving work of breathing and wheezing on reassessment, although continues to be tachypneic and tachycardic.  Repeat EKG continues to show sinus tachycardia, troponin within normal limits and I have low suspicion for ACS.  D-dimer is also reassuring and I doubt PE.  We will give dose of IV magnesium for ongoing bronchospasms and case discussed with hospitalist for admission.      ____________________________________________   FINAL CLINICAL IMPRESSION(S) / ED DIAGNOSES  Final diagnoses:  COPD exacerbation (HCC)  Shortness of breath     ED Discharge Orders     None        Note:  This document was prepared using Dragon voice recognition software and may include unintentional dictation errors.    Chesley Noon, MD 07/28/21 Graylon Gunning    Chesley Noon, MD 07/28/21 [phone]

## 2021-07-28 NOTE — ED Notes (Addendum)
Pt to ED c/o wheezing and wet cough since this morning. Started feeling sick last night. Hot/cold flashes. Vomited last night and this morning several times. Not vomiting or nauseous now. Pt feels SOB with walking and coughing. Chest feels tight. Has significant inspiratory and expiratory wheezing.

## 2021-07-28 NOTE — ED Triage Notes (Signed)
Pt from home, states she started feeling badly yesterday, increased SOB, took albuterol inhaler and that did not help. Pt states her chest feels tight. Pt states she has felt hot at times, states no one else at home is ill. Pt with inspiratory and expiratory wheezing bilaterally. Pt with increased SOB with exertion and body aches.

## 2021-07-28 NOTE — ED Notes (Signed)
Pt difficult blood draw collection- Lab called and asked to attempt draw

## 2021-07-28 NOTE — H&P (Addendum)
[place]   PATIENT NAME: Emily Peterson    MR#:  [id number]  DATE OF BIRTH:  [birth year]  DATE OF ADMISSION:  07/28/2021  PRIMARY CARE PHYSICIAN: Pcp, No   Patient is coming from: Home  REQUESTING/REFERRING PHYSICIAN: Chesley Noon, MD  CHIEF COMPLAINT:   Chief Complaint  Patient presents with  . Shortness of Breath    HISTORY OF PRESENT ILLNESS:  SINIYA Peterson is a 30 y.o. African-American female with medical history significant for gestational diabetes, who presented to the emergency room with acute onset of chest tightness and dyspnea with associated dry cough and wheezing which have been worsening over the last 24 hours.  She felt chest heaviness like a ton of bricks sitting on her chest.  She has been having upper back pain with cough.  She has previously been diagnosed with bronchitis but denies any specific history of COPD or asthma.  She smokes half to 1.5 pack of cigarettes per day though and has been smoking for long time.  No fever but she admits to chills yesterday and today.  She had nausea and vomiting last night but denied it today and did not have abdominal pain.  No dysuria, oliguria or hematuria or flank pain.  She has been having urinary incontinence with excessive cough.  She denies any leg pain or edema recent travels or surgeries.   ED Course: Upon presentation to the emergency room, blood pressure was 148/100 with heart rate of 120 respiratory to 24 and pulse symmetry 97% on room air.  Labs revealed unremarkable CMP and high-sensitivity troponin I was less than 2 with lactic acid 0.9.  CBC showed leukocytosis of 11.6.  D-dimer was 0.47.  UA showed 21-50 RBCs and was otherwise unremarkable.  Respiratory panel is currently pending. EKG as reviewed by me : Showed sinus tachycardia with a rate of 117 with right atrial enlargement and nonspecific T wave abnormalities. Imaging: 2 view chest x-ray showed no acute cardiopulmonary disease.  The patient was  given nebulized DuoNeb as well as albuterol MDI and albuterol nebulizer, 2 g of IV magnesium sulfate and 60 mg of p.o. prednisone.  She will be admitted to a progressive unit bed for further evaluation and management. PAST MEDICAL HISTORY:   Past Medical History:  Diagnosis Date  . Gestational diabetes    Did not go for 6 week post-partum appt  . History of abnormal cervical Pap smear    Age 24 years, subsequent PAPs normal  . History of anemia   . History of spontaneous abortion 2010  . History of urinary tract infection   . Medical history non-contributory     PAST SURGICAL HISTORY:   Past Surgical History:  Procedure Laterality Date  . NO PAST SURGERIES    . No surgical history      SOCIAL HISTORY:   Social History   Tobacco Use  . Smoking status: Every Day    Packs/day: 1.50    Years: 13.00    Pack years: 19.50    Types: Cigarettes  . Smokeless tobacco: Never  Substance Use Topics  . Alcohol use: Yes    Alcohol/week: 5.0 standard drinks    Types: 5 Shots of liquor per week    Comment: Last ETOH use 03/26/2021.    FAMILY HISTORY:   Family History  Problem Relation Age of Onset  . Cancer Paternal Grandmother   . Diabetes Paternal Grandmother   . Healthy Maternal Grandmother   . Hypertension Maternal  Grandfather   . Diabetes Father   . Healthy Mother   . Healthy Son   . Healthy Son   . Healthy Son   . Healthy Son     DRUG ALLERGIES:  No Known Allergies  REVIEW OF SYSTEMS:   ROS As per history of present illness. All pertinent systems were reviewed above. Constitutional, HEENT, cardiovascular, respiratory, GI, GU, musculoskeletal, neuro, psychiatric, endocrine, integumentary and hematologic systems were reviewed and are otherwise negative/unremarkable except for positive findings mentioned above in the HPI.   MEDICATIONS AT HOME:   Prior to Admission medications   Medication Sig Start Date End Date Taking? Authorizing Provider  albuterol (VENTOLIN  HFA) 108 (90 Base) MCG/ACT inhaler Inhale 2 puffs into the lungs every 6 (six) hours as needed for wheezing or shortness of breath. Patient not taking: No sig reported 01/09/20   Shaune Pollack, MD  amoxicillin (AMOXIL) 875 MG tablet Take 1 tablet (875 mg total) by mouth 2 (two) times daily. Patient not taking: No sig reported 10/06/20   Joni Reining, PA-C  azithromycin (ZITHROMAX Z-PAK) 250 MG tablet Take 2 tablets (500 mg) on  Day 1,  followed by 1 tablet (250 mg) once daily on Days 2 through 5. Patient not taking: No sig reported 10/03/18   Bridget Hartshorn L, PA-C  brompheniramine-pseudoephedrine-DM 30-2-10 MG/5ML syrup Take 5 mLs by mouth 4 (four) times daily as needed. Patient not taking: No sig reported 10/06/20   Joni Reining, PA-C  ibuprofen (ADVIL) 600 MG tablet Take 1 tablet (600 mg total) by mouth every 8 (eight) hours as needed. Patient not taking: No sig reported 10/06/20   Joni Reining, PA-C  metroNIDAZOLE (FLAGYL) 500 MG tablet Take 1 tablet (500 mg total) by mouth 2 (two) times daily. Patient not taking: Reported on 07/05/2021 04/07/21   Federico Flake, MD  Prenatal Vit-Fe Fumarate-FA (PRENATAL VITAMIN) 27-0.8 MG TABS Take 1 tablet by mouth daily at 6 (six) AM. Patient not taking: Reported on 07/05/2021 04/07/21   Federico Flake, MD      VITAL SIGNS:  Blood pressure 130/78, pulse (!) 136, temperature 99.5 F (37.5 C), temperature source Oral, resp. rate (!) 33, height 5\' 2"  (1.575 m), weight 102.1 kg, last menstrual period 07/21/2021, SpO2 100 %, unknown if currently breastfeeding.  PHYSICAL EXAMINATION:  Physical Exam  GENERAL:  30 y.o.-year-old patient lying in the bed with no acute distress.  EYES: Pupils equal, round, reactive to light and accommodation. No scleral icterus. Extraocular muscles intact.  HEENT: Head atraumatic, normocephalic. Oropharynx and nasopharynx clear.  NECK:  Supple, no jugular venous distention. No thyroid enlargement, no  tenderness.  LUNGS: Diffuse expiratory wheezes with tight expiratory airflow and harsh vesicular breathing.   CARDIOVASCULAR: Regular rate and rhythm, S1, S2 normal. No murmurs, rubs, or gallops.  ABDOMEN: Soft, nondistended, nontender. Bowel sounds present. No organomegaly or mass.  EXTREMITIES: No pedal edema, cyanosis, or clubbing.  NEUROLOGIC: Cranial nerves II through XII are intact. Muscle strength 5/5 in all extremities. Sensation intact. Gait not checked.  PSYCHIATRIC: The patient is alert and oriented x 3.  Normal affect and good eye contact. SKIN: No obvious rash, lesion, or ulcer.   LABORATORY PANEL:   CBC Recent Labs  Lab 07/28/21 1553  WBC 11.6*  HGB 15.0  HCT 44.2  PLT 244   ------------------------------------------------------------------------------------------------------------------  Chemistries  Recent Labs  Lab 07/28/21 1553  NA 135  K 4.1  CL 102  CO2 22  GLUCOSE 110*  BUN  8  CREATININE 0.70  CALCIUM 9.6  AST 18  ALT 38  ALKPHOS 62  BILITOT 0.8   ------------------------------------------------------------------------------------------------------------------  Cardiac Enzymes No results for input(s): TROPONINI in the last 168 hours. ------------------------------------------------------------------------------------------------------------------  RADIOLOGY:  DG Chest 2 View  Result Date: 07/28/2021 CLINICAL DATA:  Shortness of breath EXAM: CHEST - 2 VIEW COMPARISON:  10/06/2020 FINDINGS: The heart size and mediastinal contours are within normal limits. Both lungs are clear. The visualized skeletal structures are unremarkable. IMPRESSION: No active cardiopulmonary disease. Electronically Signed   By: Miyu Pang M.D.   On: 07/28/2021 17:09      IMPRESSION AND PLAN:  Active Problems:   COPD exacerbation (HCC)  1.  Acute bronchitis with bronchospasm concerning for possible COPD acute exacerbation.  The patient likely has chronic  bronchitis. - The patient will be admitted to a progressive unit bed. - We will continue steroid therapy with IV Solu-Medrol. - We will continue bronchodilator therapy with nebulized DuoNebs on a as needed and scheduled basis. - Mucolytic therapy will be provided. - We will add IV antibiotic therapy with IV Rocephin. - We will obtain sputum and blood cultures..  2.  Chest pain, likely atypical, rule out ACS. - We will follow serial troponin I's. - She will be placed on as needed sublingual nitroglycerin and morphine sulfate for pain. - Pain could be associated with dyspnea and bronchospasm as well as musculoskeletal from intercostal muscle strain with excessive cough.  3.  Sinus tachycardia. - This likely secondary to an bronchodilator therapy. - She continued to be tachycardic though with improvement of her dyspnea and wheezing. -We will utilize as needed IV Cardizem and antitussives as well as hydration.  4.  History of gestational diabetes mellitus. - We will monitor fingerstick blood glucose measures with current steroid therapy.  DVT prophylaxis: Lovenox.  Code Status: full code.  Family Communication:  The plan of care was discussed in details with the patient (and family). I answered all questions. The patient agreed to proceed with the above mentioned plan. Further management will depend upon hospital course. Disposition Plan: Back to previous home environment Consults called: none.  All the records are reviewed and case discussed with ED provider.  Status is: Inpatient  Remains inpatient appropriate because:Hemodynamically unstable, Ongoing diagnostic testing needed not appropriate for outpatient work up, Unsafe d/c plan, IV treatments appropriate due to intensity of illness or inability to take PO, and Inpatient level of care appropriate due to severity of illness  Dispo: The patient is from: Home              Anticipated d/c is to: Home              Patient currently is  not medically stable to d/c.   Difficult to place patient No   TOTAL TIME TAKING CARE OF THIS PATIENT: 55 minutes.    Hannah Beat M.D on 07/28/2021 at 7:46 PM  [facility]   From 7 PM-7 AM, contact night-coverage [email or web address]  CC: Primary care physician; Pcp, No

## 2021-07-29 LAB — CBC
HCT: 41.5 % (ref 36.0–46.0)
Hemoglobin: 13.9 g/dL (ref 12.0–15.0)
MCH: 29.3 pg (ref 26.0–34.0)
MCHC: 33.5 g/dL (ref 30.0–36.0)
MCV: 87.6 fL (ref 80.0–100.0)
Platelets: 255 10*3/uL (ref 150–400)
RBC: 4.74 MIL/uL (ref 3.87–5.11)
RDW: 13.1 % (ref 11.5–15.5)
WBC: 9.3 10*3/uL (ref 4.0–10.5)
nRBC: 0 % (ref 0.0–0.2)

## 2021-07-29 LAB — BASIC METABOLIC PANEL
Anion gap: 8 (ref 5–15)
BUN: 7 mg/dL (ref 6–20)
CO2: 22 mmol/L (ref 22–32)
Calcium: 9.1 mg/dL (ref 8.9–10.3)
Chloride: 104 mmol/L (ref 98–111)
Creatinine, Ser: 0.63 mg/dL (ref 0.44–1.00)
GFR, Estimated: >60 mL/min (ref >60)
Glucose, Bld: 164 mg/dL — ABNORMAL HIGH (ref 70–99)
Potassium: 4.5 mmol/L (ref 3.5–5.1)
Sodium: 134 mmol/L — ABNORMAL LOW (ref 135–145)

## 2021-07-29 MED ORDER — BUDESONIDE 0.25 MG/2ML IN SUSP
0.2500 mg | Freq: Two times a day (BID) | RESPIRATORY_TRACT | Status: DC
  Administered 2021-07-29 – 2021-08-01 (×7): 0.25 mg via RESPIRATORY_TRACT
  Filled 2021-07-29 (×7): qty 2

## 2021-07-29 MED ORDER — ARFORMOTEROL TARTRATE 15 MCG/2ML IN NEBU
15.0000 ug | INHALATION_SOLUTION | Freq: Two times a day (BID) | RESPIRATORY_TRACT | Status: DC
  Administered 2021-07-29 – 2021-08-01 (×7): 15 ug via RESPIRATORY_TRACT
  Filled 2021-07-29 (×8): qty 2

## 2021-07-29 NOTE — ED Notes (Signed)
Nikki RN aware of assigned bed 

## 2021-07-29 NOTE — Progress Notes (Signed)
PROGRESS NOTE    Emily Peterson  DJS:[id number] DOB: August 28, 1991 DOA: 07/28/2021 PCP: Pcp, No    Brief Narrative:   30 y.o. African-American female with medical history significant for gestational diabetes, who presented to the emergency room with acute onset of chest tightness and dyspnea with associated dry cough and wheezing which have been worsening over the last 24 hours.  She felt chest heaviness like a ton of bricks sitting on her chest.  She has been having upper back pain with cough.  She has previously been diagnosed with bronchitis but denies any specific history of COPD or asthma.  She smokes half to 1.5 pack of cigarettes per day though and has been smoking for long time.  No fever but she admits to chills yesterday and today.  She had nausea and vomiting last night but denied it today and did not have abdominal pain.  No dysuria, oliguria or hematuria or flank pain.  She has been having urinary incontinence with excessive cough.  She denies any leg pain or edema recent travels or surgeries.   Assessment & Plan:   Active Problems:   COPD exacerbation (HCC)  Acute bronchitis with bronchospasm COPD exacerbation Tobacco abuse Unclear trigger Patient does smoke cigarettes Plan: Solu-Medrol 40 mg IV every 8 hours x1 day, followed by prednisone 40 mg daily Scheduled DuoNebs every 4 hours Twice daily Brovana Twice daily Pulmicort Continue empiric Rocephin Advised tobacco cessation Mucolytic's  Chest pain, atypical ACS ruled out Suspect pleuritis versus bronchospasm versus musculoskeletal  Sinus tachycardia No chest pain  History of GDM Monitor fingersticks in setting of steroid use  Obesity BMI 41.15 This complicates care and prognosis   DVT prophylaxis: SQ Lovenox Code Status: Full Family Communication: None today.  Offered to call but patient declined Disposition Plan: Status is: Inpatient  Remains inpatient appropriate because:Inpatient level of care  appropriate due to severity of illness  Dispo: The patient is from: Home              Anticipated d/c is to: Home              Patient currently is not medically stable to d/c.   Difficult to place patient No       Level of care: Progressive Cardiac  Consultants:  None  Procedures:  None  Antimicrobials: Ceftriaxone   Subjective: Seen and examined.  Reports improvement in respiratory status since admission.  Objective: Vitals:   07/29/21 1151 07/29/21 1201 07/29/21 1300 07/29/21 1303  BP:   123/78   Pulse: 97 (!) 110 (!) 107 94  Resp: (!) 25 13 (!) 34 14  Temp:      TempSrc:      SpO2: 97% 97% 98% 97%  Weight:      Height:       No intake or output data in the 24 hours ending 07/29/21 1419 Filed Weights   07/28/21 1553 07/28/21 1604  Weight: 102.1 kg 102.1 kg    Examination:  General exam: No acute distress Respiratory system: Diffuse end expiratory wheeze in all lung fields.  Air entry equal.  Normal work of breathing.  Room air Cardiovascular system: S1-S2, RRR, no murmurs, no pedal edema Gastrointestinal system: Obese, soft, nontender, nondistended, normal bowel sounds Central nervous system: Alert and oriented. No focal neurological deficits. Extremities: Symmetric 5 x 5 power. Skin: No rashes, lesions or ulcers Psychiatry: Judgement and insight appear normal. Mood & affect appropriate.     Data Reviewed: I have personally reviewed  following labs and imaging studies  CBC: Recent Labs  Lab 07/28/21 1553 07/29/21 0618  WBC 11.6* 9.3  NEUTROABS 9.0*  --   HGB 15.0 13.9  HCT 44.2 41.5  MCV 86.7 87.6  PLT 244 255   Basic Metabolic Panel: Recent Labs  Lab 07/28/21 1553 07/29/21 0618  NA 135 134*  K 4.1 4.5  CL 102 104  CO2 22 22  GLUCOSE 110* 164*  BUN 8 7  CREATININE 0.70 0.63  CALCIUM 9.6 9.1   GFR: Estimated Creatinine Clearance: 115.1 mL/min (by C-G formula based on SCr of 0.63 mg/dL). Liver Function Tests: Recent Labs  Lab  07/28/21 1553  AST 18  ALT 38  ALKPHOS 62  BILITOT 0.8  PROT 7.9  ALBUMIN 4.2   No results for input(s): LIPASE, AMYLASE in the last 168 hours. No results for input(s): AMMONIA in the last 168 hours. Coagulation Profile: No results for input(s): INR, PROTIME in the last 168 hours. Cardiac Enzymes: No results for input(s): CKTOTAL, CKMB, CKMBINDEX, TROPONINI in the last 168 hours. BNP (last 3 results) No results for input(s): PROBNP in the last 8760 hours. HbA1C: No results for input(s): HGBA1C in the last 72 hours. CBG: No results for input(s): GLUCAP in the last 168 hours. Lipid Profile: No results for input(s): CHOL, HDL, LDLCALC, TRIG, CHOLHDL, LDLDIRECT in the last 72 hours. Thyroid Function Tests: No results for input(s): TSH, T4TOTAL, FREET4, T3FREE, THYROIDAB in the last 72 hours. Anemia Panel: No results for input(s): VITAMINB12, FOLATE, FERRITIN, TIBC, IRON, RETICCTPCT in the last 72 hours. Sepsis Labs: Recent Labs  Lab 07/28/21 1611  LATICACIDVEN 0.9    Recent Results (from the past 240 hour(s))  Resp Panel by RT-PCR (Flu A&B, Covid) Nasopharyngeal Swab     Status: None   Collection Time: 07/28/21  6:50 PM   Specimen: Nasopharyngeal Swab; Nasopharyngeal(NP) swabs in vial transport medium  Result Value Ref Range Status   SARS Coronavirus 2 by RT PCR NEGATIVE NEGATIVE Final    Comment: (NOTE) SARS-CoV-2 target nucleic acids are NOT DETECTED.  The SARS-CoV-2 RNA is generally detectable in upper respiratory specimens during the acute phase of infection. The lowest concentration of SARS-CoV-2 viral copies this assay can detect is 138 copies/mL. A negative result does not preclude SARS-Cov-2 infection and should not be used as the sole basis for treatment or other patient management decisions. A negative result may occur with  improper specimen collection/handling, submission of specimen other than nasopharyngeal swab, presence of viral mutation(s) within  the areas targeted by this assay, and inadequate number of viral copies(<138 copies/mL). A negative result must be combined with clinical observations, patient history, and epidemiological information. The expected result is Negative.  Fact Sheet for Patients:  BloggerCourse.com  Fact Sheet for Healthcare Providers:  SeriousBroker.it  This test is no t yet approved or cleared by the [place] FDA and  has been authorized for detection and/or diagnosis of SARS-CoV-2 by FDA under an Emergency Use Authorization (EUA). This EUA will remain  in effect (meaning this test can be used) for the duration of the COVID-19 declaration under Section 564(b)(1) of the Act, 21 U.S.C.section 360bbb-3(b)(1), unless the authorization is terminated  or revoked sooner.       Influenza A by PCR NEGATIVE NEGATIVE Final   Influenza B by PCR NEGATIVE NEGATIVE Final    Comment: (NOTE) The Xpert Xpress SARS-CoV-2/FLU/RSV plus assay is intended as an aid in the diagnosis of influenza from Nasopharyngeal swab specimens and should  not be used as a sole basis for treatment. Nasal washings and aspirates are unacceptable for Xpert Xpress SARS-CoV-2/FLU/RSV testing.  Fact Sheet for Patients: BloggerCourse.com  Fact Sheet for Healthcare Providers: SeriousBroker.it  This test is not yet approved or cleared by the [place] FDA and has been authorized for detection and/or diagnosis of SARS-CoV-2 by FDA under an Emergency Use Authorization (EUA). This EUA will remain in effect (meaning this test can be used) for the duration of the COVID-19 declaration under Section 564(b)(1) of the Act, 21 U.S.C. section 360bbb-3(b)(1), unless the authorization is terminated or revoked.  Performed at [facility], [address] [place], [place] [address]          Radiology Studies: DG Chest 2  View  Result Date: 07/28/2021 CLINICAL DATA:  Shortness of breath EXAM: CHEST - 2 VIEW COMPARISON:  10/06/2020 FINDINGS: The heart size and mediastinal contours are within normal limits. Both lungs are clear. The visualized skeletal structures are unremarkable. IMPRESSION: No active cardiopulmonary disease. Electronically Signed   By: Annamae Pang M.D.   On: 07/28/2021 17:09        Scheduled Meds:  arformoterol  15 mcg Nebulization BID   aspirin EC  325 mg Oral Daily   budesonide (PULMICORT) nebulizer solution  0.25 mg Nebulization BID   enoxaparin (LOVENOX) injection  0.5 mg/kg Subcutaneous Q24H   guaiFENesin  600 mg Oral BID   ipratropium-albuterol  3 mL Nebulization QID   medroxyPROGESTERone  150 mg Intramuscular Q90 days   methylPREDNISolone (SOLU-MEDROL) injection  40 mg Intravenous Q8H   Followed by   Melene Muller ON 07/30/2021] predniSONE  40 mg Oral Q breakfast   Continuous Infusions:  cefTRIAXone (ROCEPHIN)  IV Stopped (07/28/21 2226)     LOS: 1 day    Time spent: 35 minutes    Tresa Moore, MD [facility]   If 7PM-7AM, please contact night-coverage  07/29/2021, 2:19 PM

## 2021-07-30 LAB — HIV ANTIBODY (ROUTINE TESTING W REFLEX): HIV Screen 4th Generation wRfx: NONREACTIVE

## 2021-07-30 MED ORDER — NICOTINE 21 MG/24HR TD PT24
21.0000 mg | MEDICATED_PATCH | Freq: Every day | TRANSDERMAL | Status: DC
  Administered 2021-07-30 – 2021-08-01 (×3): 21 mg via TRANSDERMAL
  Filled 2021-07-30 (×3): qty 1

## 2021-07-30 NOTE — Progress Notes (Signed)
PROGRESS NOTE    Emily Peterson  IOE:[id number] DOB: [birth year] DOA: 07/28/2021 PCP: Pcp, No    Brief Narrative:   30 y.o. African-American female with medical history significant for gestational diabetes, who presented to the emergency room with acute onset of chest tightness and dyspnea with associated dry cough and wheezing which have been worsening over the last 24 hours.  She felt chest heaviness like a ton of bricks sitting on her chest.  She has been having upper back pain with cough.  She has previously been diagnosed with bronchitis but denies any specific history of COPD or asthma.  She smokes half to 1.5 pack of cigarettes per day though and has been smoking for long time.  No fever but she admits to chills yesterday and today.  She had nausea and vomiting last night but denied it today and did not have abdominal pain.  No dysuria, oliguria or hematuria or flank pain.  She has been having urinary incontinence with excessive cough.  She denies any leg pain or edema recent travels or surgeries.   Assessment & Plan:   Active Problems:   COPD exacerbation (HCC)  Acute bronchitis with bronchospasm COPD exacerbation Tobacco abuse Unclear trigger Patient does smoke cigarettes Wheezing persistent on exam Remains on room air Plan: Prednisone 40 mg daily Scheduled DuoNebs every 4 hours Twice daily Brovana Twice daily Pulmicort Continue empiric Rocephin Advised tobacco cessation Offer nicotine patch Mucolytic's  Chest pain, atypical ACS ruled out Suspect pleuritis versus bronchospasm versus musculoskeletal .  Pain control  Sinus tachycardia No chest pain  History of GDM Monitor fingersticks in setting of steroid use  Obesity BMI 41.15 This complicates care and prognosis   DVT prophylaxis: SQ Lovenox Code Status: Full Family Communication: Family member at bedside 10/8 Disposition Plan: Status is: Inpatient  Remains inpatient appropriate because:Inpatient level  of care appropriate due to severity of illness  Dispo: The patient is from: Home              Anticipated d/c is to: Home              Patient currently is not medically stable to d/c.   Difficult to place patient No       Level of care: Progressive Cardiac  Consultants:  None  Procedures:  None  Antimicrobials: Ceftriaxone   Subjective: Seen and examined.  Feels better overall but wheezing persists  Objective: Vitals:   07/30/21 0715 07/30/21 0759 07/30/21 1100 07/30/21 1140  BP:  118/70  125/75  Pulse:  92  97  Resp:  16  18  Temp:  98 F (36.7 C)  98.2 F (36.8 C)  TempSrc:      SpO2: 98% 97% 97% 100%  Weight:      Height:        Intake/Output Summary (Last 24 hours) at 07/30/2021 1144 Last data filed at 07/30/2021 0953 Gross per 24 hour  Intake 340 ml  Output 400 ml  Net -60 ml   Filed Weights   07/28/21 1553 07/28/21 1604  Weight: 102.1 kg 102.1 kg    Examination:  General exam: No apparent distress.  Sitting up in bed Respiratory system: Diffuse end expiratory wheeze.  Equal air entry bilaterally.  Normal work breathing.  Room air Cardiovascular system: S1-S2, RRR, no murmurs, no pedal edema Gastrointestinal system: Obese, soft, nontender, nondistended, normal bowel sounds Central nervous system: Alert and oriented. No focal neurological deficits. Extremities: Symmetric 5 x 5 power. Skin: No rashes,  lesions or ulcers Psychiatry: Judgement and insight appear normal. Mood & affect appropriate.     Data Reviewed: I have personally reviewed following labs and imaging studies  CBC: Recent Labs  Lab 07/28/21 1553 07/29/21 0618  WBC 11.6* 9.3  NEUTROABS 9.0*  --   HGB 15.0 13.9  HCT 44.2 41.5  MCV 86.7 87.6  PLT 244 255   Basic Metabolic Panel: Recent Labs  Lab 07/28/21 1553 07/29/21 0618  NA 135 134*  K 4.1 4.5  CL 102 104  CO2 22 22  GLUCOSE 110* 164*  BUN 8 7  CREATININE 0.70 0.63  CALCIUM 9.6 9.1   GFR: Estimated  Creatinine Clearance: 115.1 mL/min (by C-G formula based on SCr of 0.63 mg/dL). Liver Function Tests: Recent Labs  Lab 07/28/21 1553  AST 18  ALT 38  ALKPHOS 62  BILITOT 0.8  PROT 7.9  ALBUMIN 4.2   No results for input(s): LIPASE, AMYLASE in the last 168 hours. No results for input(s): AMMONIA in the last 168 hours. Coagulation Profile: No results for input(s): INR, PROTIME in the last 168 hours. Cardiac Enzymes: No results for input(s): CKTOTAL, CKMB, CKMBINDEX, TROPONINI in the last 168 hours. BNP (last 3 results) No results for input(s): PROBNP in the last 8760 hours. HbA1C: No results for input(s): HGBA1C in the last 72 hours. CBG: No results for input(s): GLUCAP in the last 168 hours. Lipid Profile: No results for input(s): CHOL, HDL, LDLCALC, TRIG, CHOLHDL, LDLDIRECT in the last 72 hours. Thyroid Function Tests: No results for input(s): TSH, T4TOTAL, FREET4, T3FREE, THYROIDAB in the last 72 hours. Anemia Panel: No results for input(s): VITAMINB12, FOLATE, FERRITIN, TIBC, IRON, RETICCTPCT in the last 72 hours. Sepsis Labs: Recent Labs  Lab 07/28/21 1611  LATICACIDVEN 0.9    Recent Results (from the past 240 hour(s))  Resp Panel by RT-PCR (Flu A&B, Covid) Nasopharyngeal Swab     Status: None   Collection Time: 07/28/21  6:50 PM   Specimen: Nasopharyngeal Swab; Nasopharyngeal(NP) swabs in vial transport medium  Result Value Ref Range Status   SARS Coronavirus 2 by RT PCR NEGATIVE NEGATIVE Final    Comment: (NOTE) SARS-CoV-2 target nucleic acids are NOT DETECTED.  The SARS-CoV-2 RNA is generally detectable in upper respiratory specimens during the acute phase of infection. The lowest concentration of SARS-CoV-2 viral copies this assay can detect is 138 copies/mL. A negative result does not preclude SARS-Cov-2 infection and should not be used as the sole basis for treatment or other patient management decisions. A negative result may occur with  improper specimen  collection/handling, submission of specimen other than nasopharyngeal swab, presence of viral mutation(s) within the areas targeted by this assay, and inadequate number of viral copies(<138 copies/mL). A negative result must be combined with clinical observations, patient history, and epidemiological information. The expected result is Negative.  Fact Sheet for Patients:  BloggerCourse.com  Fact Sheet for Healthcare Providers:  SeriousBroker.it  This test is no t yet approved or cleared by the [place] FDA and  has been authorized for detection and/or diagnosis of SARS-CoV-2 by FDA under an Emergency Use Authorization (EUA). This EUA will remain  in effect (meaning this test can be used) for the duration of the COVID-19 declaration under Section 564(b)(1) of the Act, 21 U.S.C.section 360bbb-3(b)(1), unless the authorization is terminated  or revoked sooner.       Influenza A by PCR NEGATIVE NEGATIVE Final   Influenza B by PCR NEGATIVE NEGATIVE Final    Comment: (  NOTE) The Xpert Xpress SARS-CoV-2/FLU/RSV plus assay is intended as an aid in the diagnosis of influenza from Nasopharyngeal swab specimens and should not be used as a sole basis for treatment. Nasal washings and aspirates are unacceptable for Xpert Xpress SARS-CoV-2/FLU/RSV testing.  Fact Sheet for Patients: BloggerCourse.com  Fact Sheet for Healthcare Providers: SeriousBroker.it  This test is not yet approved or cleared by the [place] FDA and has been authorized for detection and/or diagnosis of SARS-CoV-2 by FDA under an Emergency Use Authorization (EUA). This EUA will remain in effect (meaning this test can be used) for the duration of the COVID-19 declaration under Section 564(b)(1) of the Act, 21 U.S.C. section 360bbb-3(b)(1), unless the authorization is terminated or revoked.  Performed at [facility], [address] [place], [place] [address]          Radiology Studies: DG Chest 2 View  Result Date: 07/28/2021 CLINICAL DATA:  Shortness of breath EXAM: CHEST - 2 VIEW COMPARISON:  10/06/2020 FINDINGS: The heart size and mediastinal contours are within normal limits. Both lungs are clear. The visualized skeletal structures are unremarkable. IMPRESSION: No active cardiopulmonary disease. Electronically Signed   By: Juliane Pang M.D.   On: 07/28/2021 17:09        Scheduled Meds:  arformoterol  15 mcg Nebulization BID   aspirin EC  325 mg Oral Daily   budesonide (PULMICORT) nebulizer solution  0.25 mg Nebulization BID   enoxaparin (LOVENOX) injection  0.5 mg/kg Subcutaneous Q24H   guaiFENesin  600 mg Oral BID   ipratropium-albuterol  3 mL Nebulization QID   nicotine  21 mg Transdermal Daily   predniSONE  40 mg Oral Q breakfast   Continuous Infusions:  cefTRIAXone (ROCEPHIN)  IV 1 g (07/29/21 1952)     LOS: 2 days    Time spent: 25 minutes    Tresa Moore, MD [facility]   If 7PM-7AM, please contact night-coverage  07/30/2021, 11:44 AM

## 2021-07-31 MED ORDER — IPRATROPIUM-ALBUTEROL 0.5-2.5 (3) MG/3ML IN SOLN
3.0000 mL | Freq: Three times a day (TID) | RESPIRATORY_TRACT | Status: DC
  Administered 2021-07-31 – 2021-08-01 (×4): 3 mL via RESPIRATORY_TRACT
  Filled 2021-07-31 (×3): qty 3

## 2021-07-31 MED ORDER — LIDOCAINE 5 % EX PTCH
1.0000 | MEDICATED_PATCH | CUTANEOUS | Status: DC
  Administered 2021-07-31 – 2021-08-01 (×2): 1 via TRANSDERMAL
  Filled 2021-07-31 (×2): qty 1

## 2021-07-31 MED ORDER — METHYLPREDNISOLONE SODIUM SUCC 125 MG IJ SOLR
120.0000 mg | INTRAMUSCULAR | Status: DC
  Administered 2021-07-31 – 2021-08-01 (×2): 120 mg via INTRAVENOUS
  Filled 2021-07-31 (×2): qty 2

## 2021-07-31 NOTE — Progress Notes (Signed)
PROGRESS NOTE    Emily Peterson  VUY:[id number] DOB: November 19, 1990 DOA: 07/28/2021 PCP: Pcp, No    Brief Narrative:   30 y.o. African-American female with medical history significant for gestational diabetes, who presented to the emergency room with acute onset of chest tightness and dyspnea with associated dry cough and wheezing which have been worsening over the last 24 hours.  She felt chest heaviness like a ton of bricks sitting on her chest.  She has been having upper back pain with cough.  She has previously been diagnosed with bronchitis but denies any specific history of COPD or asthma.  She smokes half to 1.5 pack of cigarettes per day though and has been smoking for long time.  No fever but she admits to chills yesterday and today.  She had nausea and vomiting last night but denied it today and did not have abdominal pain.  No dysuria, oliguria or hematuria or flank pain.  She has been having urinary incontinence with excessive cough.  She denies any leg pain or edema recent travels or surgeries.   Assessment & Plan:   Active Problems:   COPD exacerbation (HCC)  Acute bronchitis with bronchospasm COPD exacerbation Tobacco abuse Unclear trigger Patient does smoke cigarettes Wheezing persistent on exam Remains on room air Plan: Escalate steroids for today, Solu-Medrol 40 mg every 8 hours Scheduled DuoNebs every 4 hours Twice daily Brovana Twice daily Pulmicort Continue empiric Rocephin Advised tobacco cessation Offer nicotine patch Mucolytic's  Chest pain, atypical ACS ruled out Suspect pleuritis versus bronchospasm versus musculoskeletal Pain resolved  Sinus tachycardia No chest pain  History of GDM Monitor fingersticks in setting of steroid use  Obesity BMI 41.15 This complicates care and prognosis   DVT prophylaxis: SQ Lovenox Code Status: Full Family Communication: Family member at bedside 10/8 Disposition Plan: Status is: Inpatient  Remains inpatient  appropriate because:Inpatient level of care appropriate due to severity of illness  Dispo: The patient is from: Home              Anticipated d/c is to: Home              Patient currently is not medically stable to d/c.   Difficult to place patient No       Level of care: Progressive Cardiac  Consultants:  None  Procedures:  None  Antimicrobials: Ceftriaxone   Subjective: Seen and examined.  Feels better overall but wheezing persists  Objective: Vitals:   07/31/21 0718 07/31/21 0725 07/31/21 0822 07/31/21 1203  BP:   (!) 130/58 126/81  Pulse:   (!) 109 (!) 107  Resp:   16 16  Temp:   98.2 F (36.8 C) 98.4 F (36.9 C)  TempSrc:      SpO2: 97% 97% 100% 100%  Weight:      Height:        Intake/Output Summary (Last 24 hours) at 07/31/2021 1214 Last data filed at 07/31/2021 1148 Gross per 24 hour  Intake 480 ml  Output 1000 ml  Net -520 ml   Filed Weights   07/28/21 1553 07/28/21 1604  Weight: 102.1 kg 102.1 kg    Examination:  General exam: No acute distress. Respiratory system: Coarse breath sounds bilaterally.  Normal work of breathing.  Room air Cardiovascular system: S1-S2, RRR, no murmurs, no pedal edema Gastrointestinal system: Obese, soft, nontender, nondistended, normal bowel sounds Central nervous system: Alert and oriented. No focal neurological deficits. Extremities: Symmetric 5 x 5 power. Skin: No rashes, lesions or  ulcers Psychiatry: Judgement and insight appear normal. Mood & affect appropriate.     Data Reviewed: I have personally reviewed following labs and imaging studies  CBC: Recent Labs  Lab 07/28/21 1553 07/29/21 0618  WBC 11.6* 9.3  NEUTROABS 9.0*  --   HGB 15.0 13.9  HCT 44.2 41.5  MCV 86.7 87.6  PLT 244 255   Basic Metabolic Panel: Recent Labs  Lab 07/28/21 1553 07/29/21 0618  NA 135 134*  K 4.1 4.5  CL 102 104  CO2 22 22  GLUCOSE 110* 164*  BUN 8 7  CREATININE 0.70 0.63  CALCIUM 9.6 9.1    GFR: Estimated Creatinine Clearance: 115.1 mL/min (by C-G formula based on SCr of 0.63 mg/dL). Liver Function Tests: Recent Labs  Lab 07/28/21 1553  AST 18  ALT 38  ALKPHOS 62  BILITOT 0.8  PROT 7.9  ALBUMIN 4.2   No results for input(s): LIPASE, AMYLASE in the last 168 hours. No results for input(s): AMMONIA in the last 168 hours. Coagulation Profile: No results for input(s): INR, PROTIME in the last 168 hours. Cardiac Enzymes: No results for input(s): CKTOTAL, CKMB, CKMBINDEX, TROPONINI in the last 168 hours. BNP (last 3 results) No results for input(s): PROBNP in the last 8760 hours. HbA1C: No results for input(s): HGBA1C in the last 72 hours. CBG: No results for input(s): GLUCAP in the last 168 hours. Lipid Profile: No results for input(s): CHOL, HDL, LDLCALC, TRIG, CHOLHDL, LDLDIRECT in the last 72 hours. Thyroid Function Tests: No results for input(s): TSH, T4TOTAL, FREET4, T3FREE, THYROIDAB in the last 72 hours. Anemia Panel: No results for input(s): VITAMINB12, FOLATE, FERRITIN, TIBC, IRON, RETICCTPCT in the last 72 hours. Sepsis Labs: Recent Labs  Lab 07/28/21 1611  LATICACIDVEN 0.9    Recent Results (from the past 240 hour(s))  Resp Panel by RT-PCR (Flu A&B, Covid) Nasopharyngeal Swab     Status: None   Collection Time: 07/28/21  6:50 PM   Specimen: Nasopharyngeal Swab; Nasopharyngeal(NP) swabs in vial transport medium  Result Value Ref Range Status   SARS Coronavirus 2 by RT PCR NEGATIVE NEGATIVE Final    Comment: (NOTE) SARS-CoV-2 target nucleic acids are NOT DETECTED.  The SARS-CoV-2 RNA is generally detectable in upper respiratory specimens during the acute phase of infection. The lowest concentration of SARS-CoV-2 viral copies this assay can detect is 138 copies/mL. A negative result does not preclude SARS-Cov-2 infection and should not be used as the sole basis for treatment or other patient management decisions. A negative result may occur with   improper specimen collection/handling, submission of specimen other than nasopharyngeal swab, presence of viral mutation(s) within the areas targeted by this assay, and inadequate number of viral copies(<138 copies/mL). A negative result must be combined with clinical observations, patient history, and epidemiological information. The expected result is Negative.  Fact Sheet for Patients:  BloggerCourse.com  Fact Sheet for Healthcare Providers:  SeriousBroker.it  This test is no t yet approved or cleared by the [place] FDA and  has been authorized for detection and/or diagnosis of SARS-CoV-2 by FDA under an Emergency Use Authorization (EUA). This EUA will remain  in effect (meaning this test can be used) for the duration of the COVID-19 declaration under Section 564(b)(1) of the Act, 21 U.S.C.section 360bbb-3(b)(1), unless the authorization is terminated  or revoked sooner.       Influenza A by PCR NEGATIVE NEGATIVE Final   Influenza B by PCR NEGATIVE NEGATIVE Final    Comment: (NOTE) The  Xpert Xpress SARS-CoV-2/FLU/RSV plus assay is intended as an aid in the diagnosis of influenza from Nasopharyngeal swab specimens and should not be used as a sole basis for treatment. Nasal washings and aspirates are unacceptable for Xpert Xpress SARS-CoV-2/FLU/RSV testing.  Fact Sheet for Patients: BloggerCourse.com  Fact Sheet for Healthcare Providers: SeriousBroker.it  This test is not yet approved or cleared by the [place] FDA and has been authorized for detection and/or diagnosis of SARS-CoV-2 by FDA under an Emergency Use Authorization (EUA). This EUA will remain in effect (meaning this test can be used) for the duration of the COVID-19 declaration under Section 564(b)(1) of the Act, 21 U.S.C. section 360bbb-3(b)(1), unless the authorization is terminated  or revoked.  Performed at [facility], [address], [place], [place] [address]          Radiology Studies: No results found.      Scheduled Meds:  arformoterol  15 mcg Nebulization BID   aspirin EC  325 mg Oral Daily   budesonide (PULMICORT) nebulizer solution  0.25 mg Nebulization BID   enoxaparin (LOVENOX) injection  0.5 mg/kg Subcutaneous Q24H   guaiFENesin  600 mg Oral BID   ipratropium-albuterol  3 mL Nebulization TID   lidocaine  1 patch Transdermal Q24H   methylPREDNISolone (SOLU-MEDROL) injection  120 mg Intravenous Q24H   nicotine  21 mg Transdermal Daily   Continuous Infusions:     LOS: 3 days    Time spent: 25 minutes    Tresa Moore, MD [facility]   If 7PM-7AM, please contact night-coverage  07/31/2021, 12:14 PM

## 2021-08-01 MED ORDER — NICOTINE 21 MG/24HR TD PT24: 21.0000 mg | MEDICATED_PATCH | Freq: Every day | TRANSDERMAL | 0 refills | Status: AC

## 2021-08-01 MED ORDER — BUDESONIDE-FORMOTEROL FUMARATE 160-4.5 MCG/ACT IN AERO
2.0000 | INHALATION_SPRAY | Freq: Two times a day (BID) | RESPIRATORY_TRACT | 0 refills | Status: AC
Start: 2021-08-01 — End: ?

## 2021-08-01 MED ORDER — PREDNISONE 20 MG PO TABS: 40.0000 mg | ORAL_TABLET | Freq: Every day | ORAL | 0 refills | Status: AC

## 2021-08-01 MED ORDER — ALBUTEROL SULFATE HFA 108 (90 BASE) MCG/ACT IN AERS: 2.0000 | INHALATION_SPRAY | RESPIRATORY_TRACT | 0 refills | Status: AC | PRN

## 2021-08-01 MED ORDER — BENZONATATE 100 MG PO CAPS: 100.0000 mg | ORAL_CAPSULE | Freq: Three times a day (TID) | ORAL | 0 refills | Status: AC | PRN

## 2021-08-01 NOTE — Discharge Summary (Signed)
Physician Discharge Summary  Emily Peterson DZH:[id number] DOB: 07-01-1991 DOA: 07/28/2021  PCP: Pcp, No  Admit date: 07/28/2021 Discharge date: 08/01/2021  Admitted From: Home Disposition: Home  Recommendations for Outpatient Follow-up:  Follow up with PCP in 1-2 weeks   Home Health: No Equipment/Devices: None  Discharge Condition: Stable CODE STATUS: Full Diet recommendation: Regular  Brief/Interim Summary:  30 y.o. African-American female with medical history significant for gestational diabetes, who presented to the emergency room with acute onset of chest tightness and dyspnea with associated dry cough and wheezing which have been worsening over the last 24 hours.  She felt chest heaviness like a ton of bricks sitting on her chest.  She has been having upper back pain with cough.  She has previously been diagnosed with bronchitis but denies any specific history of COPD or asthma.  She smokes half to 1.5 pack of cigarettes per day though and has been smoking for long time.  No fever but she admits to chills yesterday and today.  She had nausea and vomiting last night but denied it today and did not have abdominal pain.  No dysuria, oliguria or hematuria or flank pain.  She has been having urinary incontinence with excessive cough.  She denies any leg pain or edema recent travels or surgeries.    She was placed on aggressive regimen of intravenous steroids and around-the-clock nebulizers for course of admission.  Physical exam gradually improved.  Patient remained hemodynamically stable and on room air.  At time of discharge patient still has some adventitious breath sounds but much improved from prior.  She is ambulating without difficulty.  Shortness of breath is markedly improved.  She is stable for discharge home.  At time of discharge I would recommend Symbicort 2 puffs twice daily, albuterol inhaler as needed, prednisone 40 mg a day x5 days.  Instructed to follow-up PCP.  Also  prescribed NicoDerm patches and discussed extensively about the need for smoking cessation.  Patient stressed understanding of discharge instructions.    Discharge Diagnoses:  Active Problems:   COPD exacerbation (HCC)  Acute bronchitis with bronchospasm COPD exacerbation Tobacco abuse Unclear trigger Patient does smoke cigarettes Wheezing improved Remains on room air Plan: Discharge home.  Prednisone 40 mg a day x5 days.  As needed albuterol MDI.  Scheduled Symbicort MDI prescribed.  Nicotine patches prescribed.  Discharged home with outpatient PCP follow-up.  Chest pain, atypical ACS ruled out Suspect pleuritis versus bronchospasm versus musculoskeletal Pain resolved   Sinus tachycardia No chest pain   History of GDM Monitor fingersticks in setting of steroid use   Obesity BMI 41.15 This complicates care and prognosis  Discharge Instructions  Discharge Instructions     Diet - low sodium heart healthy   Complete by: As directed    Increase activity slowly   Complete by: As directed       Allergies as of 08/01/2021   No Known Allergies      Medication List     STOP taking these medications    Prenatal Vitamin 27-0.8 MG Tabs       TAKE these medications    albuterol 108 (90 Base) MCG/ACT inhaler Commonly known as: VENTOLIN HFA Inhale 2 puffs into the lungs every 2 (two) hours as needed for wheezing or shortness of breath.   benzonatate 100 MG capsule Commonly known as: Tessalon Perles Take 1 capsule (100 mg total) by mouth 3 (three) times daily as needed for cough.   budesonide-formoterol 160-4.5 MCG/ACT inhaler  Commonly known as: Symbicort Inhale 2 puffs into the lungs 2 (two) times daily.   nicotine 21 mg/24hr patch Commonly known as: NICODERM CQ - dosed in mg/24 hours Place 1 patch (21 mg total) onto the skin daily. Start taking on: August 02, 2021   predniSONE 20 MG tablet Commonly known as: DELTASONE Take 2 tablets (40 mg total) by  mouth daily for 5 days. Start taking on: August 02, 2021        No Known Allergies  Consultations: None   Procedures/Studies: DG Chest 2 View  Result Date: 07/28/2021 CLINICAL DATA:  Shortness of breath EXAM: CHEST - 2 VIEW COMPARISON:  10/06/2020 FINDINGS: The heart size and mediastinal contours are within normal limits. Both lungs are clear. The visualized skeletal structures are unremarkable. IMPRESSION: No active cardiopulmonary disease. Electronically Signed   By: Ivette Pang M.D.   On: 07/28/2021 17:09      Subjective: Seen and examined on the day of discharge.  Still some adventitious breath sounds but stable otherwise.  No shortness of breath on ambulation.  Stable for discharge home.  Discharge Exam: Vitals:   08/01/21 1124 08/01/21 1223  BP: 105/76   Pulse: 87 (!) 101  Resp: 16 18  Temp: 97.7 F (36.5 C)   SpO2: 99% 98%   Vitals:   08/01/21 0736 08/01/21 0751 08/01/21 1124 08/01/21 1223  BP:  106/62 105/76   Pulse: 96 92 87 (!) 101  Resp: 16 20 16 18   Temp:  97.8 F (36.6 C) 97.7 F (36.5 C)   TempSrc:      SpO2: 98% 100% 99% 98%  Weight:      Height:        General: Pt is alert, awake, not in acute distress Cardiovascular: RRR, S1/S2 +, no rubs, no gallops Respiratory: Scattered rhonchi bilaterally.  Normal work of breathing.  Room air Abdominal: Soft, NT, ND, bowel sounds + Extremities: no edema, no cyanosis    The results of significant diagnostics from this hospitalization (including imaging, microbiology, ancillary and laboratory) are listed below for reference.     Microbiology: Recent Results (from the past 240 hour(s))  Resp Panel by RT-PCR (Flu A&B, Covid) Nasopharyngeal Swab     Status: None   Collection Time: 07/28/21  6:50 PM   Specimen: Nasopharyngeal Swab; Nasopharyngeal(NP) swabs in vial transport medium  Result Value Ref Range Status   SARS Coronavirus 2 by RT PCR NEGATIVE NEGATIVE Final    Comment: (NOTE) SARS-CoV-2  target nucleic acids are NOT DETECTED.  The SARS-CoV-2 RNA is generally detectable in upper respiratory specimens during the acute phase of infection. The lowest concentration of SARS-CoV-2 viral copies this assay can detect is 138 copies/mL. A negative result does not preclude SARS-Cov-2 infection and should not be used as the sole basis for treatment or other patient management decisions. A negative result may occur with  improper specimen collection/handling, submission of specimen other than nasopharyngeal swab, presence of viral mutation(s) within the areas targeted by this assay, and inadequate number of viral copies(<138 copies/mL). A negative result must be combined with clinical observations, patient history, and epidemiological information. The expected result is Negative.  Fact Sheet for Patients:  09/27/21  Fact Sheet for Healthcare Providers:  BloggerCourse.com  This test is no t yet approved or cleared by the [place] FDA and  has been authorized for detection and/or diagnosis of SARS-CoV-2 by FDA under an Emergency Use Authorization (EUA). This EUA will remain  in effect (meaning this  test can be used) for the duration of the COVID-19 declaration under Section 564(b)(1) of the Act, 21 U.S.C.section 360bbb-3(b)(1), unless the authorization is terminated  or revoked sooner.       Influenza A by PCR NEGATIVE NEGATIVE Final   Influenza B by PCR NEGATIVE NEGATIVE Final    Comment: (NOTE) The Xpert Xpress SARS-CoV-2/FLU/RSV plus assay is intended as an aid in the diagnosis of influenza from Nasopharyngeal swab specimens and should not be used as a sole basis for treatment. Nasal washings and aspirates are unacceptable for Xpert Xpress SARS-CoV-2/FLU/RSV testing.  Fact Sheet for Patients: BloggerCourse.com  Fact Sheet for Healthcare  Providers: SeriousBroker.it  This test is not yet approved or cleared by the [place] FDA and has been authorized for detection and/or diagnosis of SARS-CoV-2 by FDA under an Emergency Use Authorization (EUA). This EUA will remain in effect (meaning this test can be used) for the duration of the COVID-19 declaration under Section 564(b)(1) of the Act, 21 U.S.C. section 360bbb-3(b)(1), unless the authorization is terminated or revoked.  Performed at [facility], [address] [place], [place] [address]      Labs: BNP (last 3 results) No results for input(s): BNP in the last 8760 hours. Basic Metabolic Panel: Recent Labs  Lab 07/28/21 1553 07/29/21 0618  NA 135 134*  K 4.1 4.5  CL 102 104  CO2 22 22  GLUCOSE 110* 164*  BUN 8 7  CREATININE 0.70 0.63  CALCIUM 9.6 9.1   Liver Function Tests: Recent Labs  Lab 07/28/21 1553  AST 18  ALT 38  ALKPHOS 62  BILITOT 0.8  PROT 7.9  ALBUMIN 4.2   No results for input(s): LIPASE, AMYLASE in the last 168 hours. No results for input(s): AMMONIA in the last 168 hours. CBC: Recent Labs  Lab 07/28/21 1553 07/29/21 0618  WBC 11.6* 9.3  NEUTROABS 9.0*  --   HGB 15.0 13.9  HCT 44.2 41.5  MCV 86.7 87.6  PLT 244 255   Cardiac Enzymes: No results for input(s): CKTOTAL, CKMB, CKMBINDEX, TROPONINI in the last 168 hours. BNP: Invalid input(s): POCBNP CBG: No results for input(s): GLUCAP in the last 168 hours. D-Dimer No results for input(s): DDIMER in the last 72 hours. Hgb A1c No results for input(s): HGBA1C in the last 72 hours. Lipid Profile No results for input(s): CHOL, HDL, LDLCALC, TRIG, CHOLHDL, LDLDIRECT in the last 72 hours. Thyroid function studies No results for input(s): TSH, T4TOTAL, T3FREE, THYROIDAB in the last 72 hours.  Invalid input(s): FREET3 Anemia work up No results for input(s): VITAMINB12, FOLATE, FERRITIN, TIBC, IRON, RETICCTPCT in the last 72  hours. Urinalysis    Component Value Date/Time   COLORURINE YELLOW (A) 07/28/2021 1611   APPEARANCEUR CLEAR (A) 07/28/2021 1611   APPEARANCEUR Hazy 01/11/2014 0912   LABSPEC 1.024 07/28/2021 1611   LABSPEC 1.017 01/11/2014 0912   PHURINE 5.0 07/28/2021 1611   GLUCOSEU NEGATIVE 07/28/2021 1611   GLUCOSEU Negative 01/11/2014 0912   HGBUR LARGE (A) 07/28/2021 1611   BILIRUBINUR NEGATIVE 07/28/2021 1611   BILIRUBINUR Negative 01/11/2014 0912   KETONESUR NEGATIVE 07/28/2021 1611   PROTEINUR 100 (A) 07/28/2021 1611   NITRITE NEGATIVE 07/28/2021 1611   LEUKOCYTESUR NEGATIVE 07/28/2021 1611   LEUKOCYTESUR 1+ 01/11/2014 0912   Sepsis Labs Invalid input(s): PROCALCITONIN,  WBC,  LACTICIDVEN Microbiology Recent Results (from the past 240 hour(s))  Resp Panel by RT-PCR (Flu A&B, Covid) Nasopharyngeal Swab     Status: None   Collection Time:  07/28/21  6:50 PM   Specimen: Nasopharyngeal Swab; Nasopharyngeal(NP) swabs in vial transport medium  Result Value Ref Range Status   SARS Coronavirus 2 by RT PCR NEGATIVE NEGATIVE Final    Comment: (NOTE) SARS-CoV-2 target nucleic acids are NOT DETECTED.  The SARS-CoV-2 RNA is generally detectable in upper respiratory specimens during the acute phase of infection. The lowest concentration of SARS-CoV-2 viral copies this assay can detect is 138 copies/mL. A negative result does not preclude SARS-Cov-2 infection and should not be used as the sole basis for treatment or other patient management decisions. A negative result may occur with  improper specimen collection/handling, submission of specimen other than nasopharyngeal swab, presence of viral mutation(s) within the areas targeted by this assay, and inadequate number of viral copies(<138 copies/mL). A negative result must be combined with clinical observations, patient history, and epidemiological information. The expected result is Negative.  Fact Sheet for Patients:   BloggerCourse.com  Fact Sheet for Healthcare Providers:  SeriousBroker.it  This test is no t yet approved or cleared by the [place] FDA and  has been authorized for detection and/or diagnosis of SARS-CoV-2 by FDA under an Emergency Use Authorization (EUA). This EUA will remain  in effect (meaning this test can be used) for the duration of the COVID-19 declaration under Section 564(b)(1) of the Act, 21 U.S.C.section 360bbb-3(b)(1), unless the authorization is terminated  or revoked sooner.       Influenza A by PCR NEGATIVE NEGATIVE Final   Influenza B by PCR NEGATIVE NEGATIVE Final    Comment: (NOTE) The Xpert Xpress SARS-CoV-2/FLU/RSV plus assay is intended as an aid in the diagnosis of influenza from Nasopharyngeal swab specimens and should not be used as a sole basis for treatment. Nasal washings and aspirates are unacceptable for Xpert Xpress SARS-CoV-2/FLU/RSV testing.  Fact Sheet for Patients: BloggerCourse.com  Fact Sheet for Healthcare Providers: SeriousBroker.it  This test is not yet approved or cleared by the [place] FDA and has been authorized for detection and/or diagnosis of SARS-CoV-2 by FDA under an Emergency Use Authorization (EUA). This EUA will remain in effect (meaning this test can be used) for the duration of the COVID-19 declaration under Section 564(b)(1) of the Act, 21 U.S.C. section 360bbb-3(b)(1), unless the authorization is terminated or revoked.  Performed at [facility], [address] [place], [place] [address]      Time coordinating discharge: Over 30 minutes  SIGNED:   Tresa Moore, MD  [facility] 08/01/2021, 1:10 PM Pager   If 7PM-7AM, please contact night-coverage

## 2021-08-01 NOTE — Plan of Care (Signed)

## 2021-08-01 NOTE — Progress Notes (Signed)
Order to discharge pt home.  Discharge instructions/AVS given to patient and reviewed - education provided as needed.  Pt advised to call PCP and/or come back to the hospital if there are any problems. Pt verbalized understanding.    

## 2022-06-30 ENCOUNTER — Ambulatory Visit: Payer: Medicaid Other | Attending: Neurology

## 2022-06-30 DIAGNOSIS — G4733 Obstructive sleep apnea (adult) (pediatric): Secondary | ICD-10-CM | POA: Diagnosis present

## 2022-09-26 ENCOUNTER — Ambulatory Visit (INDEPENDENT_AMBULATORY_CARE_PROVIDER_SITE_OTHER): Payer: Medicaid Other | Admitting: Obstetrics & Gynecology

## 2022-09-26 ENCOUNTER — Other Ambulatory Visit (HOSPITAL_COMMUNITY)
Admission: RE | Admit: 2022-09-26 | Discharge: 2022-09-26 | Disposition: A | Discharge status: Home / Self Care | Payer: Medicaid Other | Source: Ambulatory Visit | Attending: Obstetrics & Gynecology | Admitting: Obstetrics & Gynecology

## 2022-09-26 ENCOUNTER — Other Ambulatory Visit (HOSPITAL_COMMUNITY)
Admission: RE | Admit: 2022-09-26 | Discharge: 2022-09-26 | Disposition: A | Discharge status: Home / Self Care | Payer: Medicaid Other | Source: Ambulatory Visit

## 2022-09-26 VITALS — BP 108/75 | HR 86 | Ht 64.0 in | Wt 229.5 lb

## 2022-09-26 DIAGNOSIS — D069 Carcinoma in situ of cervix, unspecified: Secondary | ICD-10-CM | POA: Diagnosis not present

## 2022-09-26 DIAGNOSIS — N898 Other specified noninflammatory disorders of vagina: Secondary | ICD-10-CM | POA: Diagnosis present

## 2022-09-26 DIAGNOSIS — B977 Papillomavirus as the cause of diseases classified elsewhere: Secondary | ICD-10-CM | POA: Insufficient documentation

## 2022-09-26 DIAGNOSIS — R87612 Low grade squamous intraepithelial lesion on cytologic smear of cervix (LGSIL): Secondary | ICD-10-CM

## 2022-09-26 DIAGNOSIS — Z3202 Encounter for pregnancy test, result negative: Secondary | ICD-10-CM | POA: Diagnosis not present

## 2022-09-26 DIAGNOSIS — Z3200 Encounter for pregnancy test, result unknown: Secondary | ICD-10-CM

## 2022-09-26 LAB — POCT URINE PREGNANCY: Preg Test, Ur: NEGATIVE

## 2022-09-26 NOTE — Addendum Note (Signed)
Addended by: Burtis Junes on: 09/26/2022 09:41 AM   Modules accepted: Orders

## 2022-09-26 NOTE — Addendum Note (Signed)
Addended by: Allie Bossier on: 09/26/2022 09:39 AM   Modules accepted: Orders

## 2022-09-26 NOTE — Progress Notes (Addendum)
   Established Patient Office Visit  Subjective   Patient ID: Emily Peterson, female    DOB: Aug 30, 1991  Age: 31 y.o. MRN: [id number]  No chief complaint on file.   HPI    31 yo monogamous P4 here for a colpo, referred from [facility]. 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 She reports that she had an abnormal pap at age 89 but no treatment was needed. She reports that she had Gardasil in the past. She uses depo provera for contraception.   Objective:     BP 108/75   Pulse 86   Ht 5\' 4"  (1.626 m)   Wt 229 lb 8 oz (104.1 kg)   LMP 08/31/2022 (Approximate)   BMI 39.39 kg/m    Physical Exam   Results for orders placed or performed in visit on 09/26/22  POCT urine pregnancy  Result Value Ref Range   Preg Test, Ur Negative Negative     Well nourished, well hydrated Black female, no apparent distress She is ambulating and conversing normally. UPT negative, consent signed, time out done A frothy white discharge was noted and Aptima testing was sent. (She has had trich in the past). Cervix prepped with acetic acid. Transformation zone seen in its entirety. Colpo adequate. Changes c/w LGSIL seen in a circumferencial fashion at the os (acetowhite changes) I obtained a biopsy at the 6 and 12 o'clock positions.  Silver nitrate was used to achieve hemostasis. ECC obtained. She tolerated the procedure well.  Assessment & Plan:   Problem List Items Addressed This Visit   None Visit Diagnoses     Possible pregnancy    -  Primary   Relevant Orders   POCT urine pregnancy (Completed)   LGSIL on Pap smear of cervix       High risk HPV infection           We discussed that she will probably need some form of treatment. I will send her a message next week with the results and treatment plan.   14/05/23, MD

## 2022-09-27 ENCOUNTER — Encounter: Payer: Self-pay | Admitting: Obstetrics & Gynecology

## 2022-09-27 LAB — CERVICOVAGINAL ANCILLARY ONLY
Bacterial Vaginitis (gardnerella): POSITIVE — AB
Candida Glabrata: NEGATIVE
Candida Vaginitis: NEGATIVE
Chlamydia: NEGATIVE
Comment: NEGATIVE
Comment: NEGATIVE
Comment: NEGATIVE
Comment: NEGATIVE
Comment: NEGATIVE
Comment: NORMAL
Neisseria Gonorrhea: NEGATIVE
Trichomonas: NEGATIVE

## 2022-09-28 ENCOUNTER — Other Ambulatory Visit: Payer: Self-pay | Admitting: Obstetrics & Gynecology

## 2022-09-28 ENCOUNTER — Encounter: Payer: Self-pay | Admitting: Obstetrics & Gynecology

## 2022-09-28 MED ORDER — METRONIDAZOLE 500 MG PO TABS: 500.0000 mg | ORAL_TABLET | Freq: Two times a day (BID) | ORAL | 0 refills | Status: AC

## 2022-09-28 NOTE — Progress Notes (Signed)
Flagyl prescribed for bv Mychart message sent to patient.

## 2022-10-02 ENCOUNTER — Encounter: Payer: Self-pay | Admitting: Obstetrics & Gynecology

## 2022-10-02 LAB — SURGICAL PATHOLOGY

## 2022-10-03 NOTE — Telephone Encounter (Signed)
Patient is calling needing to with a nurse about both procedures and the difference between both so she is aware of her options. Please advise?

## 2022-11-22 ENCOUNTER — Other Ambulatory Visit: Payer: Self-pay | Admitting: Nurse Practitioner

## 2022-11-22 DIAGNOSIS — R7612 Nonspecific reaction to cell mediated immunity measurement of gamma interferon antigen response without active tuberculosis: Secondary | ICD-10-CM

## 2022-12-11 ENCOUNTER — Ambulatory Visit (LOCAL_COMMUNITY_HEALTH_CENTER): Payer: Medicaid Other

## 2022-12-11 ENCOUNTER — Ambulatory Visit: Payer: Medicaid Other

## 2022-12-11 DIAGNOSIS — Z719 Counseling, unspecified: Secondary | ICD-10-CM

## 2022-12-11 DIAGNOSIS — Z23 Encounter for immunization: Secondary | ICD-10-CM

## 2022-12-11 NOTE — Progress Notes (Addendum)
Patient seen in nurse clinic for Hep B vaccine. 2nd dose of Hep B vaccine given on 11/10/2022 was Heplisav-B - confirmed with [facility] and with written documentation of receipt.  Consultation with Josie Saunders and Karlene Einstein, RN - 3rd dose could be Heplisav-B. Heplisav-B vaccine given IM in left deltoid. Tolerated well. 2 copies of NCIR provided to patient and explained her Hep B series was complete.  VIS provided. [facility] contacted to update entry for 11/10/2022 vaccine so NCIR would show series complete.

## 2022-12-27 ENCOUNTER — Other Ambulatory Visit (HOSPITAL_COMMUNITY)
Admission: RE | Admit: 2022-12-27 | Discharge: 2022-12-27 | Disposition: A | Discharge status: Home / Self Care | Payer: Medicaid Other | Source: Ambulatory Visit | Attending: Obstetrics & Gynecology | Admitting: Obstetrics & Gynecology

## 2022-12-27 ENCOUNTER — Ambulatory Visit (INDEPENDENT_AMBULATORY_CARE_PROVIDER_SITE_OTHER): Payer: Medicaid Other | Admitting: Obstetrics & Gynecology

## 2022-12-27 ENCOUNTER — Encounter: Payer: Self-pay | Admitting: Obstetrics & Gynecology

## 2022-12-27 VITALS — BP 127/80 | Ht 62.0 in | Wt 231.0 lb

## 2022-12-27 DIAGNOSIS — Z3202 Encounter for pregnancy test, result negative: Secondary | ICD-10-CM | POA: Diagnosis not present

## 2022-12-27 DIAGNOSIS — N871 Moderate cervical dysplasia: Secondary | ICD-10-CM | POA: Diagnosis present

## 2022-12-27 DIAGNOSIS — D069 Carcinoma in situ of cervix, unspecified: Secondary | ICD-10-CM

## 2022-12-27 LAB — POCT URINE PREGNANCY: Preg Test, Ur: NEGATIVE

## 2022-12-27 NOTE — Progress Notes (Signed)
   Established Patient Office Visit  Subjective   Patient ID: Emily Peterson, female    DOB: June 29, 1991  Age: 32 y.o. MRN: [id number]  Chief Complaint  Patient presents with   leep    HPI   32 yo monogamous P4 here for a LEEP. I saw her 09/26/2022 for a colpo due to pap showing LGSIL with HR HPV. 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  ROS She uses depo provera for contraception, has irregular bleeding.  Objective:     BP 127/80   Ht '5\' 2"'$  (1.575 m)   Wt 231 lb (104.8 kg)   LMP 12/14/2022   BMI 42.25 kg/m    Physical Exam   No results found for any visits on 12/27/22.    The ASCVD Risk score (Arnett DK, et al., 2019) failed to calculate for the following reasons:   The 2019 ASCVD risk score is only valid for ages 28 to 24   Well nourished, well hydrated Black female, no apparent distress  Colpo Biopsy:   Risks, benefits, alternatives, and limitations of procedure explained to patient, including pain, bleeding, infection, failure to remove abnormal tissue and failure to cure dysplasia, need for repeat procedures, damage to pelvic organs, cervical incompetence.  Role of HPV,cervical dysplasia and need for close followup was empasized. Informed written consent was obtained. All questions were answered. Time out performed. Urine pregnancy test was negative.  ??Procedure: The patient was placed in lithotomy position and the bivalved coated speculum was placed in the patient's vagina. A grounding pad placed on the patient. Acetic acid was applied to the cervix and all appeared normal. I sprayed Hurricaine spray.  Local anesthesia was administered via an intracervical block using 15cc of 2% Lidocaine with epinephrine. The suction was turned on and the medium semicircular LEEP tip on 55 Watts of cutting current and cautery was used to excise the entire transformation zone and any areas of visible dysplasia. I obtained an ECC.  Excellent hemostasis was achieved using  roller ball coagulation set at 50 Watts coagulation current. As per my usual, I coated the cone bed with Monsel's. The speculum was removed from the vagina. Specimens were sent to pathology.  ?The patient tolerated the procedure well. Post-operative instructions given to patient, including instruction to seek medical attention for persistent bright red bleeding, fever, abdominal/pelvic pain, dysuria, nausea or vomiting. She was also told about the possibility of having copious yellow to black tinged discharge for weeks. She was counseled to avoid anything in the vagina (sex/douching/tampons) for 3 weeks.   Assessment & Plan:   Problem List Items Addressed This Visit   None Visit Diagnoses     CIN II (cervical intraepithelial neoplasia II)    -  Primary   Relevant Orders   LEEP with colposcopy   Surgical pathology      I will send her a message about the pathology in about a week.   Emily Filbert, MD

## 2022-12-27 NOTE — Addendum Note (Signed)
Addended by: Quintella Baton D on: 12/27/2022 09:09 AM   Modules accepted: Orders

## 2022-12-28 ENCOUNTER — Encounter: Payer: Self-pay | Admitting: Obstetrics & Gynecology

## 2023-01-01 LAB — SURGICAL PATHOLOGY

## 2023-01-02 ENCOUNTER — Telehealth: Payer: Self-pay | Admitting: Obstetrics & Gynecology

## 2023-01-02 NOTE — Telephone Encounter (Signed)
The patient is calling requesting a call back about her lab results. Please advise? The patient is aware Dr. Hulan Fray is out of the office and will return on 3/13.

## 2023-01-02 NOTE — Telephone Encounter (Signed)
Advised Emily Peterson Dr. Hulan Fray will review her results and add a comment letting her know or [place] know about the results and someone would be calling her tomorrow.

## 2023-01-03 ENCOUNTER — Encounter: Payer: Self-pay | Admitting: Obstetrics & Gynecology

## 2023-02-14 ENCOUNTER — Telehealth: Payer: Self-pay

## 2023-02-14 NOTE — Telephone Encounter (Signed)
[facility] RN received phone call from pt stating she had some testing done through [facility]. Pt reports she was told by [place] she needed to contact [facility] for medication. Pt states she will be bringing copies of positive QFT Gold test and negative chest x-ray to [facility]. Pt is interested in LTBI.

## 2023-06-17 ENCOUNTER — Emergency Department: Payer: Medicaid Other

## 2023-06-17 ENCOUNTER — Encounter: Payer: Self-pay | Admitting: Emergency Medicine

## 2023-06-17 ENCOUNTER — Emergency Department
Admission: EM | Admit: 2023-06-17 | Discharge: 2023-06-17 | Disposition: A | Discharge status: Home / Self Care | Payer: Medicaid Other | Attending: Emergency Medicine | Admitting: Emergency Medicine

## 2023-06-17 ENCOUNTER — Other Ambulatory Visit: Payer: Self-pay

## 2023-06-17 DIAGNOSIS — J4541 Moderate persistent asthma with (acute) exacerbation: Secondary | ICD-10-CM | POA: Diagnosis not present

## 2023-06-17 DIAGNOSIS — F17210 Nicotine dependence, cigarettes, uncomplicated: Secondary | ICD-10-CM | POA: Insufficient documentation

## 2023-06-17 DIAGNOSIS — Z1152 Encounter for screening for COVID-19: Secondary | ICD-10-CM | POA: Insufficient documentation

## 2023-06-17 DIAGNOSIS — J029 Acute pharyngitis, unspecified: Secondary | ICD-10-CM

## 2023-06-17 DIAGNOSIS — R0981 Nasal congestion: Secondary | ICD-10-CM | POA: Diagnosis present

## 2023-06-17 DIAGNOSIS — J45901 Unspecified asthma with (acute) exacerbation: Secondary | ICD-10-CM

## 2023-06-17 DIAGNOSIS — R051 Acute cough: Secondary | ICD-10-CM

## 2023-06-17 LAB — RESP PANEL BY RT-PCR (RSV, FLU A&B, COVID)  RVPGX2
Influenza A by PCR: NEGATIVE
Influenza B by PCR: NEGATIVE
Resp Syncytial Virus by PCR: NEGATIVE
SARS Coronavirus 2 by RT PCR: NEGATIVE

## 2023-06-17 MED ORDER — PREDNISONE 10 MG (21) PO TBPK: ORAL_TABLET | ORAL | 0 refills | Status: AC

## 2023-06-17 MED ORDER — IPRATROPIUM-ALBUTEROL 0.5-2.5 (3) MG/3ML IN SOLN
3.0000 mL | Freq: Once | RESPIRATORY_TRACT | Status: AC
  Administered 2023-06-17: 3 mL via RESPIRATORY_TRACT
  Filled 2023-06-17: qty 3

## 2023-06-17 MED ORDER — METHYLPREDNISOLONE SODIUM SUCC 125 MG IJ SOLR
125.0000 mg | Freq: Once | INTRAMUSCULAR | Status: AC
  Administered 2023-06-17: 125 mg via INTRAVENOUS
  Filled 2023-06-17: qty 2

## 2023-06-17 NOTE — ED Provider Notes (Signed)
[facility] Emergency Department Provider Note   ____________________________________________   Event Date/Time   First MD Initiated Contact with Patient 06/17/23 0840     (approximate)  I have reviewed the triage vital signs and the nursing notes.   HISTORY  Chief Complaint Nasal Congestion    HPI Emily Peterson is a 32 y.o. female presents to the emergency room for complaint of COVID-like symptoms that started on Wednesday while she was at work.  She reports that the majority of the symptoms have resolved today other than the chest congestion.  Reports that she was taking TheraFlu with good results.  Patient reports that she started with cough, nasal and chest congestion, body aches, fever.  Patient reports that she is left with chest congestion, slight cough, wheezing.  She reports that she has not been using her maintenance inhaler for some time.  However she has been using her albuterol inhaler over the last 4 days with minimal results over the last 24 hours.     Past Medical History:  Diagnosis Date   Gestational diabetes    Did not go for 6 week post-partum appt   History of abnormal cervical Pap smear    Age 82 years, subsequent PAPs normal   History of anemia    History of spontaneous abortion 2010   History of urinary tract infection    Medical history non-contributory     Patient Active Problem List   Diagnosis Date Noted   COPD exacerbation (HCC) 07/28/2021   Personal history of gestational diabetes 04/07/2021   ADHD 04/07/2021   Depression dx'd 7 years ago during pregnancy 04/07/2021   Obesity (BMI 41.3) 04/15/2019   History of abnormal cervical Pap smear 03/12/2019   Smoker 1/2-1.5 ppd 03/12/2019    Past Surgical History:  Procedure Laterality Date   NO PAST SURGERIES     No surgical history      Prior to Admission medications   Medication Sig Start Date End Date Taking? Authorizing Provider  predniSONE (STERAPRED UNI-PAK  21 TAB) 10 MG (21) TBPK tablet Take 6 pills day one and then decrease by 1 pill each day 06/17/23  Yes Herschell Dimes, NP  albuterol (VENTOLIN HFA) 108 (90 Base) MCG/ACT inhaler Inhale 2 puffs into the lungs every 2 (two) hours as needed for wheezing or shortness of breath. 08/01/21   Tresa Moore, MD  budesonide-formoterol (SYMBICORT) 160-4.5 MCG/ACT inhaler Inhale 2 puffs into the lungs 2 (two) times daily. Patient not taking: Reported on 12/27/2022 08/01/21   Tresa Moore, MD  medroxyPROGESTERone (DEPO-PROVERA) 150 MG/ML injection Inject 150 mg into the muscle every 3 (three) months.    [provider]  metroNIDAZOLE (FLAGYL) 500 MG tablet Take 1 tablet (500 mg total) by mouth 2 (two) times daily. Patient not taking: Reported on 12/27/2022 09/28/22   Allie Bossier, MD  nicotine (NICODERM CQ - DOSED IN MG/24 HOURS) 21 mg/24hr patch Place 1 patch (21 mg total) onto the skin daily. Patient not taking: Reported on 12/27/2022 08/02/21   Tresa Moore, MD    Allergies Patient has no known allergies.  Family History  Problem Relation Age of Onset   Cancer Paternal Grandmother    Diabetes Paternal Grandmother    Healthy Maternal Grandmother    Hypertension Maternal Grandfather    Diabetes Father    Healthy Mother    Healthy Son    Healthy Son    Healthy Son    Healthy Son  Social History Social History   Tobacco Use   Smoking status: Every Day    Current packs/day: 1.50    Average packs/day: 1.5 packs/day for 13.0 years (19.5 ttl pk-yrs)    Types: Cigarettes   Smokeless tobacco: Never  Vaping Use   Vaping status: Never Used  Substance Use Topics   Alcohol use: Yes    Alcohol/week: 5.0 standard drinks of alcohol    Types: 5 Shots of liquor per week    Comment: Last ETOH use 03/26/2021.   Drug use: Not Currently    Types: Marijuana    Comment: Last marijuana use 10 years.    Review of Systems  Constitutional: Positive for fever Eyes: No visual  changes. ENT: Positive for nasal congestion Cardiovascular: Denies chest pain. Respiratory: Denies shortness of breath.  Positive for cough, chest congestion, wheezing Gastrointestinal: No abdominal pain.  No nausea, no vomiting.  No diarrhea.   Musculoskeletal: Positive for body aches Skin: Negative for rash. Neurological: Negative for headaches, focal weakness or numbness.   ____________________________________________   PHYSICAL EXAM:  VITAL SIGNS: ED Triage Vitals  Encounter Vitals Group     BP 06/17/23 0832 130/88     Systolic BP Percentile --      Diastolic BP Percentile --      Pulse Rate 06/17/23 0832 100     Resp 06/17/23 0832 18     Temp 06/17/23 0832 98.8 F (37.1 C)     Temp Source 06/17/23 0832 Oral     SpO2 06/17/23 0832 99 %     Weight 06/17/23 0831 220 lb (99.8 kg)     Height 06/17/23 0831 5\' 2"  (1.575 m)     Head Circumference --      Peak Flow --      Pain Score 06/17/23 0831 0     Pain Loc --      Pain Education --      Exclude from Growth Chart --     Constitutional: Alert and oriented. Well appearing and in no acute distress. Eyes: Conjunctivae are normal. PERRL. EOMI. Head: Atraumatic. Nose: No congestion/rhinnorhea. Mouth/Throat: Mucous membranes are moist.  Oropharynx non-erythematous. Neck: No stridor.   Hematological/Lymphatic/Immunilogical: No cervical lymphadenopathy. Cardiovascular: Normal rate, regular rhythm. Grossly normal heart sounds.  Good peripheral circulation. Respiratory: Normal respiratory effort.  No retractions.  Patient has wheezing in all lung fields, with decreased air movement. Gastrointestinal: Soft and nontender. No distention.  Neurologic:  Normal speech and language. No gross focal neurologic deficits are appreciated. No gait instability. Skin:  Skin is warm, dry and intact. No rash noted. Psychiatric: Mood and affect are normal. Speech and behavior are normal.  ____________________________________________    LABS (all labs ordered are listed, but only abnormal results are displayed)  Labs Reviewed  RESP PANEL BY RT-PCR (RSV, FLU A&B, COVID)  [id number]   ____________________________________________  EKG   ____________________________________________  RADIOLOGY  ED MD interpretation: Chest x-ray reviewed by me and read by radiologist.  Official radiology report(s): DG Chest 2 View  Result Date: 06/17/2023 CLINICAL DATA:  32 year old female with history of cough and wheezing. EXAM: CHEST - 2 VIEW COMPARISON:  Chest x-ray 07/28/2021. FINDINGS: Lung volumes are normal. No consolidative airspace disease. No pleural effusions. No pneumothorax. No pulmonary nodule or mass noted. Pulmonary vasculature and the cardiomediastinal silhouette are within normal limits. IMPRESSION: No radiographic evidence of acute cardiopulmonary disease. Electronically Signed   By: Trudie Reed M.D.   On: 06/17/2023 10:52    ____________________________________________  PROCEDURES  Procedure(s) performed: None  Procedures  Critical Care performed: No  ____________________________________________   INITIAL IMPRESSION / ASSESSMENT AND PLAN / ED COURSE     Emily Peterson is a 32 y.o. female presents to the emergency room for complaint of COVID-like symptoms that started on Wednesday while she was at work.  She reports that the majority of the symptoms have resolved today other than the chest congestion.  Reports that she was taking TheraFlu with good results.  Patient reports that she started with cough, nasal and chest congestion, body aches, fever.  Patient reports that she is left with chest congestion, slight cough, wheezing.  She reports that she has not been using her maintenance inhaler for some time.  However she has been using her albuterol inhaler over the last 4 days with minimal results over the last 24 hours.  Will COVID test and give DuoNeb. In triage they did full respiratory panel. Will  reassess after breathing treatment.  COVID, flu, RSV are all negative. On reassessment patient continues to wheeze throughout all lung fields bilaterally. Plan is to get chest x-ray, repeat DuoNeb, give Solu-Medrol.  Then reassess.  This x-ray is normal. On reassessment, patient's lungs are clear in bilateral upper lobes.  There is still a slight wheeze noted in bilateral lower lobes however air movement is good.  Patient reports that she feels less chest tightness and is able to breathe significantly better.  Patient will be discharged home with prednisone taper. I have encouraged her to restart her Symbicort. She is encouraged to take her albuterol inhaler every 4 hours for the next 48 hours.  She should follow-up with her primary care provider in the next week.  Patient will be discharged home in stable condition at this time.         ____________________________________________   FINAL CLINICAL IMPRESSION(S) / ED DIAGNOSES  Final diagnoses:  Moderate asthma with exacerbation, unspecified whether persistent  Acute cough  Sore throat     ED Discharge Orders          Ordered    predniSONE (STERAPRED UNI-PAK 21 TAB) 10 MG (21) TBPK tablet        06/17/23 1105             Note:  This document was prepared using Dragon voice recognition software and may include unintentional dictation errors.     Herschell Dimes, NP 06/17/23 1107    Sharman Cheek, MD 06/17/23 1226

## 2023-06-17 NOTE — Discharge Instructions (Signed)
You have been seen today in the emergency room for what I believed to be an asthma exacerbation.  Your COVID test was negative. I encourage you to take your Symbicort as directed.  You should also take your albuterol inhaler every 4 hours for the next 48 hours.  I have called in a prescription for a prednisone taper that you should take as directed.  Please follow-up with your primary care provider in about a week.

## 2023-06-17 NOTE — ED Triage Notes (Signed)
Patient to ED via POV for congestion, body aches, and cough. Ongoing since Wed night.

## 2023-07-11 ENCOUNTER — Telehealth: Payer: Self-pay

## 2023-07-11 NOTE — Telephone Encounter (Signed)
Call pt re: 11/14/22 Positive QFT Gold (at [facility]) 11/21/22 Chest X-ray - "Negative chest." (at [facility]) 06/17/23 Chest X-ray - "No radiographic evidence of acute cardiopulmonary disease." (at [facility])  Completed Epi Discuss Latent vs Active Offer LTBI

## 2023-07-11 NOTE — Telephone Encounter (Addendum)
-----   Message from Nurse Murray Hodgkins sent at 07/10/2023  8:57 AM EDT ----- Regarding: FW: TB follow up Spoke with [employer]. She will be faxing over QFT Gold result from 11/14/22 and also phone note from 11/20/22 when pt/dr spoke. 11/20/22 denied BCG vaccine and symptoms; negative PPDs in past, etc.  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

## 2023-07-12 ENCOUNTER — Ambulatory Visit: Payer: Medicaid Other

## 2023-07-12 VITALS — Ht 62.0 in | Wt 230.0 lb

## 2023-07-12 DIAGNOSIS — Z227 Latent tuberculosis: Secondary | ICD-10-CM | POA: Diagnosis not present

## 2023-07-12 DIAGNOSIS — R7612 Nonspecific reaction to cell mediated immunity measurement of gamma interferon antigen response without active tuberculosis: Secondary | ICD-10-CM | POA: Insufficient documentation

## 2023-07-12 NOTE — Telephone Encounter (Signed)
Phone call to pt at [phone]. Pt answered and confirmed identity.  Epi completed.  Dr Wyvonnia Lora to review chart. Pt has hx of negative PPDs, and pt received live vaccine a few days before getting QFT drawn. Dr Wyvonnia Lora to advise.

## 2023-07-12 NOTE — Progress Notes (Addendum)
Phone call to pt re: 11/14/22 Positive QFT Gold (at [facility]) 11/21/22 Chest X-ray - "Negative chest." (at [facility]) 06/17/23 Chest X-ray - "No radiographic evidence of acute cardiopulmonary disease." (at [facility])  The information documented and confirmed in this document was obtained during phone interview with pt.  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  *Pt states she had several vaccines in Jan and Feb 2024. Pt had a live vaccine, Varicella, on 11/10/2022. QFT Gold drawn on 11/14/2022. ________________________  Travel outside [place]: Cruise to [place] - 03/2017 - 5 days Cruise to [place] 07/2017 - 4 days Cruise to [place]- 01/2018 - 5 days Cruise to [place] - 06/2021 - 5 days  Is at baseline for asthma (SOB) Not using her inhalers every day.  Pt states she understands the difference between Latent and Active TB, was already explained to her. 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  Pt counseled that Dr. Wyvonnia Lora would be reviewing her history, medications, etc, and [facility] would contact her with the recommendations.

## 2023-07-13 ENCOUNTER — Telehealth: Payer: Self-pay | Admitting: Surgery

## 2023-07-13 DIAGNOSIS — R7612 Nonspecific reaction to cell mediated immunity measurement of gamma interferon antigen response without active tuberculosis: Secondary | ICD-10-CM

## 2023-07-13 NOTE — Telephone Encounter (Signed)
Phone call to pt regarding getting a copy of negative PPD documentation from Aug 2024.  States she had an x-ray in Aug 2024 due to Asthma exacerbation.  Pt counseled Dr. Wyvonnia Lora would like to review the Aug 2024 PPD documentation.  Pt will be uploading/sending the PPD document to MyChart.

## 2023-07-13 NOTE — Telephone Encounter (Signed)
Patient has had a history of negative PPDs; in January of 2024 she received live vaccine varicella on 11/10/2022 followed by QFT test 4 days later on 11/14/2022. QFT was positive but should not have been drawn so soon after a live vaccine administration, and was likely a false positive.   Patient had CXR at that time that was normal. Patient had recent PPD placed on 06/05/2023 that was read as negative at 0 mm on 06/07/2023, also had another CXR that was negative on 06/17/2023.   Patient does not have LTBI, QFT from 11/14/2022 was a false positive. Last PPD of 0 mm read on 06/07/2023.   Patient has supplied documentation of negative PPD from 06/07/2023 and it has been sent for scanning into EPIC.   Jennye Moccasin, MD

## 2023-09-11 ENCOUNTER — Encounter: Payer: Self-pay | Admitting: Obstetrics & Gynecology

## 2023-09-11 ENCOUNTER — Other Ambulatory Visit (HOSPITAL_COMMUNITY)
Admission: RE | Admit: 2023-09-11 | Discharge: 2023-09-11 | Disposition: A | Discharge status: Home / Self Care | Payer: Medicaid Other | Source: Ambulatory Visit | Attending: Obstetrics & Gynecology | Admitting: Obstetrics & Gynecology

## 2023-09-11 ENCOUNTER — Ambulatory Visit: Payer: Medicaid Other | Admitting: Obstetrics & Gynecology

## 2023-09-11 VITALS — BP 128/76 | HR 130 | Ht 62.0 in | Wt 232.0 lb

## 2023-09-11 DIAGNOSIS — R87810 Cervical high risk human papillomavirus (HPV) DNA test positive: Secondary | ICD-10-CM | POA: Diagnosis present

## 2023-09-11 DIAGNOSIS — B977 Papillomavirus as the cause of diseases classified elsewhere: Secondary | ICD-10-CM | POA: Insufficient documentation

## 2023-09-11 DIAGNOSIS — N870 Mild cervical dysplasia: Secondary | ICD-10-CM

## 2023-09-11 DIAGNOSIS — Z23 Encounter for immunization: Secondary | ICD-10-CM | POA: Diagnosis not present

## 2023-09-11 NOTE — Progress Notes (Signed)
    GYNECOLOGY PROGRESS NOTE  Subjective:    Patient ID: TANESHIA GUEL, female    DOB: 07/10/1991, 32 y.o.   MRN: [id number]  HPI  Patient is a 32 y.o. [id number] here for colpo. She had a LGSIL pap with + HR HPV in 2023. I did a biopsy and colpo with CIN 2 on biopsy and negative ECC. I did a LEEP 12/27/2022. Pathology showed CIN 1 and 2 with a negative ECC.  She had a follow up pap at her primary care recently and it was normal cytology with + HR HPV.   She is UTD with her depo provera used for contraception.  She does smoke and is aware that it increases the risk of cervical cancer. She was able to check her online immunization records and discovered that she has not had Gardasil.  The following portions of the patient's history were reviewed and updated as appropriate: allergies, current medications, past family history, past medical history, past social history, past surgical history, and problem list.  Review of Systems Pertinent items are noted in HPI.  She graduates with a LPN degree in 3 weeks.  Objective:   Blood pressure 128/76, pulse (!) 130, height 5\' 2"  (1.575 m), weight 232 lb (105.2 kg). Body mass index is 42.43 kg/m. Well nourished, well hydrated Black female, no apparent distress She is ambulating and conversing normally. Acanthosis nigricans noted (She does have elevated A1C and sees her primary care provider for this) Consent signed, time out done Speculum placed. Cervix prepped with acetic acid. Transformation zone seen in its entirety. Colpo adequate. Colposcopic findings normal. Cervix is well-healed after LEEP ECC obtained. She tolerated the procedure well.   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

## 2023-09-13 LAB — SURGICAL PATHOLOGY

## 2023-09-17 ENCOUNTER — Encounter: Payer: Self-pay | Admitting: Obstetrics & Gynecology

## 2023-11-12 ENCOUNTER — Ambulatory Visit: Payer: Medicaid Other

## 2023-11-13 ENCOUNTER — Ambulatory Visit: Payer: Medicaid Other

## 2023-11-13 VITALS — BP 112/69 | HR 112 | Ht 62.0 in | Wt 227.0 lb

## 2023-11-13 DIAGNOSIS — Z23 Encounter for immunization: Secondary | ICD-10-CM | POA: Diagnosis not present

## 2023-11-13 NOTE — Patient Instructions (Signed)
HPV (Human Papillomavirus) Vaccine: What You Need to Know Many vaccine information statements are available in Spanish and other languages. See PromoAge.com.br. 1. Why get vaccinated? HPV (human papillomavirus) vaccine can prevent infection with some types of human papillomavirus. HPV infections can cause certain types of cancers, including: cervical, vaginal, and vulvar cancers in women penile cancer in men anal cancers in both men and women cancers of tonsils, base of tongue, and back of throat (oropharyngeal cancer) in both men and women HPV infections can also cause anogenital warts. HPV vaccine can prevent over 90% of cancers caused by HPV. HPV is spread through intimate skin-to-skin or sexual contact. HPV infections are so common that nearly all people will get at least one type of HPV at some time in their lives. Most HPV infections go away on their own within 2 years. But sometimes HPV infections will last longer and can cause cancers later in life. 2. HPV vaccine HPV vaccine is routinely recommended for adolescents at 63 or 33 years of age to ensure they are protected before they are exposed to the virus. HPV vaccine may be given beginning at age 61 years and vaccination is recommended for everyone through 33 years of age. HPV vaccine may be given to adults 27 through 33 years of age, based on discussions between the patient and health care provider. Most children who get the first dose before 59 years of age need 2 doses of HPV vaccine. People who get the first dose at or after 14 years of age and younger people with certain immunocompromising conditions need 3 doses. Your health care provider can give you more information. HPV vaccine may be given at the same time as other vaccines. 3. Talk with your health care provider Tell your vaccination provider if the person getting the vaccine: Has had an allergic reaction after a previous dose of HPV vaccine, or has any severe,  life-threatening allergies Is pregnant--HPV vaccine is not recommended until after pregnancy In some cases, your health care provider may decide to postpone HPV vaccination until a future visit. People with minor illnesses, such as a cold, may be vaccinated. People who are moderately or severely ill should usually wait until they recover before getting HPV vaccine. Your health care provider can give you more information. 4. Risks of a vaccine reaction Soreness, redness, or swelling where the shot is given can happen after HPV vaccination. Fever or headache can happen after HPV vaccination. People sometimes faint after medical procedures, including vaccination. Tell your provider if you feel dizzy or have vision changes or ringing in the ears. As with any medicine, there is a very remote chance of a vaccine causing a severe allergic reaction, other serious injury, or death. 5. What if there is a serious problem? An allergic reaction could occur after the vaccinated person leaves the clinic. If you see signs of a severe allergic reaction (hives, swelling of the face and throat, difficulty breathing, a fast heartbeat, dizziness, or weakness), call 9-1-1 and get the person to the nearest hospital. For other signs that concern you, call your health care provider. Adverse reactions should be reported to the Vaccine Adverse Event Reporting System (VAERS). Your health care provider will usually file this report, or you can do it yourself. Visit the VAERS website at www.vaers.LAgents.no or call 214-432-8477. VAERS is only for reporting reactions, and VAERS staff members do not give medical advice. 6. The National Vaccine Injury Compensation Program The [employer] Vaccine Injury Compensation Program (VICP) is a  federal program that was created to compensate people who may have been injured by certain vaccines. Claims regarding alleged injury or death due to vaccination have a time limit for filing, which may be as  short as two years. Visit the VICP website at SpiritualWord.at or call 782-657-5734 to learn about the program and about filing a claim. 7. How can I learn more? Ask your health care provider. Call your local or state health department. Visit the website of the Food and Drug Administration (FDA) for vaccine package inserts and additional information at FinderList.no. Contact the Centers for Disease Control and Prevention (CDC): Call 204-638-4696 (1-800-CDC-INFO) or Visit CDC's website at PicCapture.uy. Source: CDC Vaccine Information Statement HPV Vaccine (05/28/2020) This same material is available at FootballExhibition.com.br for no charge. This information is not intended to replace advice given to you by your health care provider. Make sure you discuss any questions you have with your health care provider. Document Revised: 01/24/2023 Document Reviewed: 10/30/2022 Elsevier Patient Education  2024 [employer].

## 2023-11-13 NOTE — Progress Notes (Signed)
    NURSE VISIT NOTE  Subjective:    Patient ID: Emily Peterson, female    DOB: 08/19/1991, 33 y.o.   MRN: [id number]  HPI  Patient is a 33 y.o. [id number] female Domestic Partner African American female who presents for her second Gardasil injection. Order to administer given by Nicholaus Bloom, MD on 09/11/2023.   Objective:    BP 112/69   Pulse (!) 112   Ht 5\' 2"  (1.575 m)   Wt 227 lb (103 kg)   BMI 41.52 kg/m   33 y.o. LMP: within weeks  Contraception:  Hormonal Contraception: Injection, Rings and Patches Given by: Sheliah Hatch, CMA Site:  left deltoid  Lab Review  No results found for any visits on 11/13/23.    Assessment:   1. Need for HPV vaccination      Plan:   Patient will return in 4 months for third injection.    Fonda Kinder, CMA

## 2024-03-10 ENCOUNTER — Ambulatory Visit: Payer: Medicaid Other

## 2024-09-28 ENCOUNTER — Emergency Department
Admission: EM | Admit: 2024-09-28 | Discharge: 2024-09-28 | Disposition: A | Discharge status: Home / Self Care | Payer: Self-pay | Attending: Emergency Medicine | Admitting: Emergency Medicine

## 2024-09-28 ENCOUNTER — Other Ambulatory Visit: Payer: Self-pay

## 2024-09-28 ENCOUNTER — Emergency Department: Payer: Self-pay

## 2024-09-28 DIAGNOSIS — J45901 Unspecified asthma with (acute) exacerbation: Secondary | ICD-10-CM | POA: Insufficient documentation

## 2024-09-28 DIAGNOSIS — J069 Acute upper respiratory infection, unspecified: Secondary | ICD-10-CM | POA: Insufficient documentation

## 2024-09-28 DIAGNOSIS — F17210 Nicotine dependence, cigarettes, uncomplicated: Secondary | ICD-10-CM | POA: Insufficient documentation

## 2024-09-28 LAB — RESP PANEL BY RT-PCR (RSV, FLU A&B, COVID)  RVPGX2
Influenza A by PCR: NEGATIVE
Influenza B by PCR: NEGATIVE
Resp Syncytial Virus by PCR: NEGATIVE
SARS Coronavirus 2 by RT PCR: NEGATIVE

## 2024-09-28 MED ORDER — PREDNISONE 20 MG PO TABS: 40.0000 mg | ORAL_TABLET | Freq: Every day | ORAL | 0 refills | Status: AC

## 2024-09-28 MED ORDER — PREDNISONE 20 MG PO TABS: 40.0000 mg | ORAL_TABLET | Freq: Every day | ORAL | 0 refills | Status: DC

## 2024-09-28 MED ORDER — IPRATROPIUM-ALBUTEROL 0.5-2.5 (3) MG/3ML IN SOLN
3.0000 mL | Freq: Once | RESPIRATORY_TRACT | Status: AC
  Administered 2024-09-28: 3 mL via RESPIRATORY_TRACT
  Filled 2024-09-28: qty 3

## 2024-09-28 MED ORDER — PREDNISONE 20 MG PO TABS
40.0000 mg | ORAL_TABLET | Freq: Once | ORAL | Status: AC
  Administered 2024-09-28: 40 mg via ORAL
  Filled 2024-09-28: qty 2

## 2024-09-28 NOTE — ED Provider Notes (Signed)
 [facility] Provider Note    Event Date/Time   First MD Initiated Contact with Patient 09/28/24 [phone]     (approximate)   History   Cough   HPI  Emily Peterson is a 33 y.o. female with a past medical history of cigarette smoking, COPD, who presents today for evaluation of cough.  Patient reports that she has had runny nose, sneezing, and cough for the past 1.5 weeks, though now just the cough persists.  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  She denies chest pain or shortness of breath.  No fevers or chills.  No abdominal pain.  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   COPD exacerbation (HCC) 07/28/2021   Personal history of gestational diabetes 04/07/2021   ADHD 04/07/2021   Depression dx'd 7 years ago during pregnancy 04/07/2021   Obesity (BMI 41.3) 04/15/2019   History of abnormal cervical Pap smear 03/12/2019   Smoker 1/2-1.5 ppd 03/12/2019          Physical Exam   Triage Vital Signs: ED Triage Vitals  Encounter Vitals Group     BP 09/28/24 0752 127/84     Girls Systolic BP Percentile --      Girls Diastolic BP Percentile --      Boys Systolic BP Percentile --      Boys Diastolic BP Percentile --      Pulse Rate 09/28/24 0752 100     Resp 09/28/24 0752 20     Temp 09/28/24 0752 98.7 F (37.1 C)     Temp Source 09/28/24 0752 Oral     SpO2 09/28/24 0752 100 %     Weight 09/28/24 0751 190 lb (86.2 kg)     Height 09/28/24 0751 5' 3 (1.6 m)     Head Circumference --      Peak Flow --      Pain Score 09/28/24 0749 3     Pain Loc --      Pain Education --      Exclude from Growth Chart --     Most recent vital signs: Vitals:   09/28/24 0802 09/28/24 0830  BP: 101/65 106/70  Pulse: 96 93  Resp: 20   Temp:    SpO2: 100% 100%     Physical Exam Vitals and nursing note reviewed.  Constitutional:      General: Awake and alert. No acute distress.    Appearance: Normal appearance. The patient is normal weight.  HENT:     Head: Normocephalic and atraumatic.     Mouth: Mucous membranes are moist.  Eyes:     General: PERRL. Normal EOMs        Right eye: No discharge.        Left eye: No discharge.     Conjunctiva/sclera: Conjunctivae normal.  Cardiovascular:     Rate and Rhythm: Normal rate and regular rhythm.     Pulses: Normal pulses.  Pulmonary:     Effort: Pulmonary effort is normal. No respiratory distress.  No tachypnea, no accessory muscle use.  Able to speak easily in complete sentences    Breath sounds: Expiratory wheezes bilaterally Abdominal:     Abdomen is soft. There is no abdominal tenderness. No rebound or guarding. No distention. Musculoskeletal:  General: No swelling. Normal range of motion.     Cervical back: Normal range of motion and neck supple.  Skin:    General: Skin is warm and dry.     Capillary Refill: Capillary refill takes less than 2 seconds.     Findings: No rash.  Neurological:     Mental Status: The patient is awake and alert.      ED Results / Procedures / Treatments   Labs (all labs ordered are listed, but only abnormal results are displayed) Labs Reviewed  RESP PANEL BY RT-PCR (RSV, FLU A&B, COVID)  [id number]     EKG     RADIOLOGY I independently reviewed and interpreted imaging and agree with radiologists findings.     PROCEDURES:  Critical Care performed:   Procedures   MEDICATIONS ORDERED IN ED: Medications  ipratropium-albuterol  (DUONEB) 0.5-2.5 (3) MG/3ML nebulizer solution 3 mL (3 mLs Nebulization Given 09/28/24 0809)  predniSONE  (DELTASONE ) tablet 40 mg (40 mg Oral Given 09/28/24 0809)     IMPRESSION / MDM / ASSESSMENT AND PLAN / ED COURSE  I reviewed the triage vital signs and the nursing notes.   Differential diagnosis includes,  but is not limited to, URI, bronchitis, pneumonia, COPD/asthma exacerbation.  Patient is awake and alert, hemodynamically stable and afebrile.  She has a normal oxygen saturation of 100% on room air and demonstrates no increased work of breathing.  No tachypnea or accessory muscle use.  She does have wheezes bilaterally, and still actively smokes, therefore she was given a DuoNeb and prednisone  to help with her symptoms.  She reports that she has albuterol  and Symbicort  at home.  Upon reevaluation, patient reports that she feels significantly improved.  She was given 4 more days of prednisone  given her history of asthma.  Her chest x-ray reveals no cardiopulmonary abnormality and her swabs are negative.  I suspect other viral etiology triggering an asthma exacerbation.  She demonstrates no increased work of breathing and has normal oxygen saturation of 100% on room air.  She is amenable to outpatient follow-up.  We discussed return precautions and outpatient follow-up.  Patient or stands and agrees with plan.  Discharged in stable condition.  Patient's presentation is most consistent with acute complicated illness / injury requiring diagnostic workup.    FINAL CLINICAL IMPRESSION(S) / ED DIAGNOSES   Final diagnoses:  Viral URI with cough  Mild asthma with exacerbation, unspecified whether persistent     Rx / DC Orders   ED Discharge Orders          Ordered    predniSONE  (DELTASONE ) 20 MG tablet  Daily with breakfast,   Status:  Discontinued        09/28/24 0845    predniSONE  (DELTASONE ) 20 MG tablet  Daily with breakfast        09/28/24 0850             Note:  This document was prepared using Dragon voice recognition software and may include unintentional dictation errors.   Prateek Knipple E, PA-C 09/28/24 1224    Ernest Ronal BRAVO, MD 09/29/24 [phone]

## 2024-09-28 NOTE — ED Triage Notes (Signed)
 Pt to ED for URI since right before Thanksgiving. Had runny nose, sneezing but now just has a cough. Respirations are unlabored. 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

## 2024-09-28 NOTE — Discharge Instructions (Addendum)
 Your chest x-ray is normal and your COVID/flu/RSV swab is negative.  You likely have another viral etiology that is triggering your asthma.  You may take the prednisone  to help with your asthma.  Return for any new, worsening, or changing symptoms or other concerns.  It was a pleasure caring for you today.

## 2024-09-28 NOTE — ED Notes (Signed)
 Patient tolerated the breathing treatment well. Patient stated she felt less wheezing.
